# Patient Record
Sex: Female | Born: 1950 | Race: White | Hispanic: No | Smoking: Never smoker
Health system: Southern US, Community
[De-identification: ages and names within clinical notes are randomized; demographics above are authoritative.]

## PROBLEM LIST (undated history)

## (undated) DIAGNOSIS — K219 Gastro-esophageal reflux disease without esophagitis: Secondary | ICD-10-CM

## (undated) DIAGNOSIS — J302 Other seasonal allergic rhinitis: Secondary | ICD-10-CM

## (undated) DIAGNOSIS — M1990 Unspecified osteoarthritis, unspecified site: Secondary | ICD-10-CM

## (undated) DIAGNOSIS — H269 Unspecified cataract: Secondary | ICD-10-CM

## (undated) DIAGNOSIS — F32A Depression, unspecified: Secondary | ICD-10-CM

## (undated) DIAGNOSIS — N393 Stress incontinence (female) (male): Secondary | ICD-10-CM

## (undated) DIAGNOSIS — M8580 Other specified disorders of bone density and structure, unspecified site: Secondary | ICD-10-CM

## (undated) DIAGNOSIS — J45909 Unspecified asthma, uncomplicated: Secondary | ICD-10-CM

## (undated) DIAGNOSIS — G4730 Sleep apnea, unspecified: Secondary | ICD-10-CM

## (undated) DIAGNOSIS — J189 Pneumonia, unspecified organism: Secondary | ICD-10-CM

## (undated) DIAGNOSIS — A483 Toxic shock syndrome: Secondary | ICD-10-CM

## (undated) DIAGNOSIS — F329 Major depressive disorder, single episode, unspecified: Secondary | ICD-10-CM

## (undated) DIAGNOSIS — F419 Anxiety disorder, unspecified: Secondary | ICD-10-CM

## (undated) DIAGNOSIS — E785 Hyperlipidemia, unspecified: Secondary | ICD-10-CM

## (undated) DIAGNOSIS — K589 Irritable bowel syndrome without diarrhea: Secondary | ICD-10-CM

## (undated) DIAGNOSIS — Z9989 Dependence on other enabling machines and devices: Secondary | ICD-10-CM

## (undated) DIAGNOSIS — R011 Cardiac murmur, unspecified: Secondary | ICD-10-CM

## (undated) DIAGNOSIS — B379 Candidiasis, unspecified: Secondary | ICD-10-CM

## (undated) DIAGNOSIS — R319 Hematuria, unspecified: Secondary | ICD-10-CM

## (undated) DIAGNOSIS — S52502A Unspecified fracture of the lower end of left radius, initial encounter for closed fracture: Secondary | ICD-10-CM

## (undated) DIAGNOSIS — E039 Hypothyroidism, unspecified: Secondary | ICD-10-CM

## (undated) HISTORY — DX: Other specified disorders of bone density and structure, unspecified site: M85.80

## (undated) HISTORY — DX: Irritable bowel syndrome, unspecified: K58.9

## (undated) HISTORY — DX: Hematuria, unspecified: R31.9

## (undated) HISTORY — PX: TOTAL KNEE ARTHROPLASTY: SHX125

## (undated) HISTORY — DX: Dependence on other enabling machines and devices: Z99.89

## (undated) HISTORY — DX: Stress incontinence (female) (male): N39.3

## (undated) HISTORY — PX: CATARACT EXTRACTION: SUR2

## (undated) HISTORY — DX: Candidiasis, unspecified: B37.9

## (undated) HISTORY — PX: COLONOSCOPY: SHX174

## (undated) HISTORY — DX: Unspecified asthma, uncomplicated: J45.909

## (undated) HISTORY — DX: Hyperlipidemia, unspecified: E78.5

## (undated) HISTORY — PX: TONSILLECTOMY: SUR1361

## (undated) HISTORY — PX: JOINT REPLACEMENT: SHX530

---

## 1998-12-24 ENCOUNTER — Other Ambulatory Visit: Admission: RE | Admit: 1998-12-24 | Discharge: 1998-12-24 | Payer: Self-pay | Admitting: Obstetrics and Gynecology

## 1999-12-08 ENCOUNTER — Emergency Department (HOSPITAL_COMMUNITY): Admission: EM | Admit: 1999-12-08 | Discharge: 1999-12-08 | Payer: Self-pay | Admitting: Emergency Medicine

## 1999-12-18 ENCOUNTER — Encounter: Payer: Self-pay | Admitting: Emergency Medicine

## 1999-12-18 ENCOUNTER — Emergency Department (HOSPITAL_COMMUNITY): Admission: EM | Admit: 1999-12-18 | Discharge: 1999-12-18 | Payer: Self-pay | Admitting: Emergency Medicine

## 2001-05-01 ENCOUNTER — Other Ambulatory Visit: Admission: RE | Admit: 2001-05-01 | Discharge: 2001-05-01 | Payer: Self-pay | Admitting: Obstetrics and Gynecology

## 2004-06-23 ENCOUNTER — Ambulatory Visit (HOSPITAL_COMMUNITY): Admission: RE | Admit: 2004-06-23 | Discharge: 2004-06-23 | Payer: Self-pay | Admitting: Orthopedic Surgery

## 2004-08-08 ENCOUNTER — Encounter: Admission: RE | Admit: 2004-08-08 | Discharge: 2004-11-06 | Payer: Self-pay | Admitting: Orthopedic Surgery

## 2004-11-03 ENCOUNTER — Inpatient Hospital Stay (HOSPITAL_COMMUNITY): Admission: RE | Admit: 2004-11-03 | Discharge: 2004-11-06 | Payer: Self-pay | Admitting: Orthopedic Surgery

## 2004-11-30 ENCOUNTER — Encounter: Admission: RE | Admit: 2004-11-30 | Discharge: 2004-12-16 | Payer: Self-pay | Admitting: Orthopedic Surgery

## 2005-01-06 ENCOUNTER — Inpatient Hospital Stay (HOSPITAL_COMMUNITY): Admission: RE | Admit: 2005-01-06 | Discharge: 2005-01-09 | Payer: Self-pay | Admitting: Orthopedic Surgery

## 2005-01-31 ENCOUNTER — Encounter: Admission: RE | Admit: 2005-01-31 | Discharge: 2005-02-24 | Payer: Self-pay | Admitting: Orthopedic Surgery

## 2005-06-09 ENCOUNTER — Emergency Department (HOSPITAL_COMMUNITY): Admission: EM | Admit: 2005-06-09 | Discharge: 2005-06-10 | Payer: Self-pay | Admitting: Emergency Medicine

## 2006-12-23 ENCOUNTER — Emergency Department (HOSPITAL_COMMUNITY): Admission: EM | Admit: 2006-12-23 | Discharge: 2006-12-24 | Payer: Self-pay | Admitting: Emergency Medicine

## 2008-09-01 ENCOUNTER — Ambulatory Visit (HOSPITAL_COMMUNITY): Admission: RE | Admit: 2008-09-01 | Discharge: 2008-09-01 | Payer: Self-pay | Admitting: Gastroenterology

## 2009-08-03 ENCOUNTER — Emergency Department (HOSPITAL_COMMUNITY): Admission: EM | Admit: 2009-08-03 | Discharge: 2009-08-03 | Payer: Self-pay | Admitting: Family Medicine

## 2010-07-05 LAB — POCT URINALYSIS DIP (DEVICE)
Glucose, UA: NEGATIVE mg/dL
Nitrite: NEGATIVE
Protein, ur: 30 mg/dL — AB
Specific Gravity, Urine: >=1.03 (ref 1.005–1.030)
Urobilinogen, UA: 1 mg/dL (ref 0.0–1.0)
pH: 5.5 (ref 5.0–8.0)

## 2010-07-05 LAB — URINE CULTURE: Colony Count: 90000

## 2010-08-30 NOTE — Op Note (Signed)
NAMEGLENNIE, BOSE NO.:  [id number]   MEDICAL RECORD NO.:  [id number]          PATIENT TYPE:  AMB   LOCATION:  ENDO                         FACILITY:  [facility]   PHYSICIAN:  Bernette Redbird, M.D.   DATE OF BIRTH:  17-Dec-1950   DATE OF PROCEDURE:  09/01/2008  DATE OF DISCHARGE:                               OPERATIVE REPORT   PROCEDURE:  Colonoscopy.   INDICATION:  A 60 year old female with family history of colon polyps in  her father and colon cancer in her paternal grandmother, no worrisome  symptoms, for initial colon cancer-screening evaluation.   FINDINGS:  Pan colonic diverticulosis, otherwise normal exam.   PROCEDURE:  The patient came as an outpatient to the [facility]  endoscopy unit.  The risks of the procedure had been reviewed with the  patient who had provided written consent.  Sedation was fentanyl 100 mcg  and Versed 10 mg IV without clinical instability.   The Pentax adult video colonoscope was advanced around the colon to the  terminal ileum, using a moderate amount of external abdominal  compression to facilitate advancement.  Pullback was then performed.  The quality of prep was excellent; hence, felt that all areas were well  seen.   This was a normal examination except for pan colonic diverticulosis of  moderate severity.  No polyps, cancer, colitis or vascular ectasia were  observed.  In retroflexion, the rectum was unremarkable.  No biopsies  were obtained.  The patient tolerated the procedure well and there were  no apparent complications.   IMPRESSION:  Pan colonic diverticulosis; otherwise, normal screening  exam in a patient with some mild risk factors for colon cancer.   PLAN:  Repeat colonoscopy in 5 years with consideration to subsequent  exams being at longer intervals if the next exam is negative for polyps.           ______________________________  Bernette Redbird, M.D.     RB/MEDQ  D:  09/01/2008  T:  09/01/2008   Job:  [id number]   cc:   Marsa Aris, Dr.

## 2010-09-02 NOTE — Op Note (Signed)
NAMETIFANNY, Belinda [name] NO.:  [id number]   MEDICAL RECORD NO.:  [id number]          PATIENT TYPE:  INP   LOCATION:  1516                         FACILITY:  [facility]   PHYSICIAN:  Madlyn Frankel. Charlann Boxer, M.D.  DATE OF BIRTH:  [birth year]   DATE OF PROCEDURE:  11/03/2004  DATE OF DISCHARGE:                                 OPERATIVE REPORT   PREOPERATIVE DIAGNOSIS:  End-stage right knee osteoarthritis.   POSTOPERATIVE DIAGNOSIS:  End-stage right knee osteoarthritis.   PROCEDURE:  Right total knee replacement.   COMPONENTS USED:  DePuy rotating platform posterior stabilized system with a  size 3 femur, size 3 tibia and size 15 mm posterior stabilized rotating  platform insert.   SURGEON:  Madlyn Frankel. Charlann Boxer, M.D.   ASSISTANT:  Cherly Beach, P.A.   ANESTHESIA:  General.   ESTIMATED BLOOD LOSS:  1 liter.   Please note there is a second procedure that the patient had an extensive  synovectomy.   DRAINS:  Drains x1.   COMPLICATIONS:  None apparent.   INDICATIONS FOR PROCEDURE:  Ms. Vannostrand is a 60 year old female who I have  been following in the office for bilateral knee osteoarthritis. She is  status post left knee arthroscopy for mechanical symptoms. She has had  persistent and unrelenting discomfort despite conservative management of  this right knee.  She wished to proceed with surgery. Her main goal and  purpose is to restore her function so she can get back into the work force.  The risks and benefits were reviewed, consent obtained.   DESCRIPTION OF PROCEDURE:  The patient was brought to the operative theater.  Once adequate anesthesia and preoperative antibiotics, 1 gram of Ancef were  administered, the patient was positioned supine on the operating table with  the right leg in a proximal thigh tourniquet. The right lower extremity was  then prepped and draped in a sterile fashion. A midline incision was made  followed by a modified medial parapatellar  arthrotomy. The knee was exposed  without patella eversion. The patient was noted to have a very hyperemic and  hypertrophic synovium throughout the superior and proximal aspect of the  knee. Once the knee was exposed including meniscectomy, attention was  directed to the femur. A starting drill was used followed by intramedullary  device with 5 degrees of valgus, 10 mm of bone resected off the distal  femur. It was sized and determined that a size 3 femur would best fit, a  size 3 block was positioned. This was based off the external rotation of the  posterior condyles which relatively matched the perpendicular Whitesides  line. I was pleased with this. The cuts were made, anterior, posterior and  chamfer cuts. It was at this point that a partial synovectomy was carried  out.   Please note that an entire complete synovectomy was carried out once the  trial components were in position.   Once these cuts were made, attention was directed to the box. This was  centered off the lateral aspect of the medial femoral condyle. Following  this, attention was directed to the  tibia. An extramedullary tibial device  was used in line with the medial third of the tibial tubercle and parallel  to the tibial shaft by palpation. Initially I was going to plan on resecting  10 mm off the lateral side. The patient was noted to have an extensive  amount of wear to the posterior medial aspect of her tibia. Initially this  10 mm cut was made, however, this defect was still present. Despite  debriding the osteophytes on the medial proximal tibia, there was still a  defect. For this reason, I chose to resect 4 more millimeters of bone in  order to remove this defect. Note that the defect was not a contained  defect. I essentially removed this entire defect following removal of these  osteophytes. At this point, I sized this tibia to be a size 3. This matched  the femoral component. Trial reduction was carried  out initially with a 12.5  and then a 15 which I felt came out to full extension and I was less  concerned with hyperextension. The knee felt good in extension all the way  through flexion. At this point with the trial components in position, the  tibial rotation was marked using a fixed bearing component. The attention  was now directed to the complete synovectomy which was done with a bovie. I  then attended to the patella. Prepatellar measurement was 23.5 mm. I  resected down to 14 mm by hand. I trialed a 35 mm patella button. This  tracked excellently without application of thumb pressure. Following this,  trial components were removed. Final tibial preparation was carried out for  rotating platform system. This included preparing it as rotating the  position noted with the fixed bearing prosthesis. Once tibial preparation  was carried out, the final components were brought to the field including  the 15 mm polyethylene liner. The wound was then copiously irrigated with a  pulse lavage knee system. Cement was prepared. All three components were  cemented into position with excessive cement being removed. This was done  without complication. A 15 mm cruciate retaining polyethylene liner was  placed, the knee brought into extension to allow the cement to cure. Once  the cement had cured, excessive cement was removed.   The final polyethylene liner was placed and the tourniquet was let down  based on the synovectomy. At this point, the patient was noted to have  fairly significant oozing all throughout the proximal suprapatellar pouch  related to the synovectomy. It would be controlled with knee flexion,  however, to close the wound the knee was in extension. Note that the  extension mechanism was then reapproximated using a #1 PDS. About 3/4 of the  way through it, I realized that perhaps a lot of this was the venous tourniquet pressure and the tourniquet was removed. This helped to  control  the oozing.   The remainder of the wound was closed in layers with a running 4-0 Monocryl.  The knee was then dressed sterilely with Steri-Strips, dressing, sponge and  a bulky sterile dressing.   Despite this oozing, the patient remained completely stable throughout the  case. She had palpable pulses both in the posterior tib and dorsalis pedis  posteriorly.   She was transferred extubated to the recovery room in stable condition where  her hematocrit and hemoglobin will be checked.   Will plan on her having a normal routine postop course.     MDO/MEDQ  D:  11/03/2004  T:  11/04/2004  Job:  [id number]

## 2010-09-02 NOTE — Discharge Summary (Signed)
Belinda [name], HARLIN NO.:  [id number]   MEDICAL RECORD NO.:  [id number]          PATIENT TYPE:  INP   LOCATION:  1516                         FACILITY:  [facility]   PHYSICIAN:  Madlyn Frankel. Charlann Boxer, M.D.  DATE OF BIRTH:  21-Mar-1951   DATE OF ADMISSION:  11/03/2004  DATE OF DISCHARGE:  11/06/2004                                 DISCHARGE SUMMARY   ADMITTING DIAGNOSES:  1.  Bilateral knee osteoarthritis, worse on the right than the left.  2.  Hypothyroidism.   DISCHARGE DIAGNOSIS:  1.  Bilateral knee osteoarthritis, worse on the right than the left.  2.  Hypothyroidism.  3.  Postoperative anemia treated with transfusion.   OPERATION:  On November 03, 2004, the patient underwent right total knee  replacement arthroplasty, all components cemented. Jenne Campus, P.A.-C.,  assisted.   BRIEF HISTORY:  This 60 year old lady had been seen by Dr. Charlann Boxer for  continuing progressive problems concerning pain in both her knees. She  underwent the left knee arthroscopy but has continued with progressive pain  into the right knee to the point where she is quite miserable. After risks  and benefits of surgery were described to the patient, she was admitted for  the above procedure.   COURSE IN THE HOSPITAL:  The patient tolerated the surgical procedure quite  well. She was placed on Coumadin protocol postoperatively for prevention of  DVT. It was noted that she did have a considerable blood loss due to the  synovitis that she had in the synovium that was resected intraoperatively.  She was transfused and brought her hemoglobin up to 10.7. She maintained a  good hemoglobin throughout her hospitalization, and final hemoglobin was  9.2, hematocrit was 26.8. She was asymptomatic. She was noted to have a  small rise in temperature of 102.2. Bedside incentive spirometer was  encouraged, and this came down. Neurovascular remained intact in the  operative extremity. She worked with physical  therapy for total knee  protocol and did quite well with that. Day of discharge, she had scant  bloody drainage superficially to the wound, but otherwise, she was  completely stable orthopedically. Neurovascular was intact. She was awake  and alert. Home arrangements for home health was arranged with Genevieve Norlander and  home equipment was given. Laboratory values in the hospital hematologically  showed a preoperative CBC completely within normal limits. Hemoglobin was  12.9, hematocrit 37.5; final hemoglobin 9.2, hematocrit 26.8. Blood  chemistries were normal. Urinalysis negative for urinary tract infection.  Chest x-ray shows septations or blebs in right upper lobe; no active  disease. No electrocardiogram seen on this chart.   CONDITION ON DISCHARGE:  Improved, stable.   PLAN:  The patient discharged to home to continue with home medications and  diet. Continue weightbearing as tolerated on the operative extremity. She is  to be on Coumadin protocol for four weeks after date of surgery, and a  Coumadin prescription is given. She will use Vicodin for discomfort and  Robaxin as a muscle relaxant. Return to see Dr. Charlann Boxer in about two weeks  after date of surgery.  Call if any problems.       DLU/MEDQ  D:  11/17/2004  T:  11/17/2004  Job:  [id number]   cc:   Loraine Leriche A. Waynard Edwards, M.D.  [address]  [place]  [place] [address]  Fax: [phone]

## 2010-09-02 NOTE — Op Note (Signed)
NAMEHAYVEN, Belinda [name] NO.:  [id number]   MEDICAL RECORD NO.:  [id number]          PATIENT TYPE:  INP   LOCATION:  1615                         FACILITY:  [facility]   PHYSICIAN:  Madlyn Frankel. Charlann Boxer, M.D.  DATE OF BIRTH:  [birth year]   DATE OF PROCEDURE:  01/06/2005  DATE OF DISCHARGE:                                 OPERATIVE REPORT   PREOPERATIVE DIAGNOSES:  End-stage left knee osteoarthritis.   POSTOPERATIVE DIAGNOSES:  End-stage left knee osteoarthritis.   PROCEDURE:  Left total knee replacement.   COMPONENTS USED:  DePuy rotating platform posterior stabilized system with a  size 3 femur, size 3 tibia, 17.5 mm posterior stabilized rotating platform  poly and a size 41 patella button.   SURGEON:  Madlyn Frankel. Charlann Boxer, M.D.   ASSISTANT:  Cherly Beach, P.A.-C.   ANESTHESIA:  General.   ESTIMATED BLOOD LOSS:  Minimal.   TOURNIQUET TIME:  80 minutes at 300 mmHg.   COMPLICATIONS:  None.   DRAINS:  Drains x1.   INDICATIONS FOR PROCEDURE:  Ms. Larsen is a very pleasant 60 year old  female who I have been following for bilateral knee osteoarthritis. She is  status post a right total knee replacement in July which has done well. She  is now ready to have her left knee done. This knee has a significant amount  of varus deformity through the proximal tibia due to her degenerative  change. She has failed all conservative measures and wishes to proceed with  total knee replacement based on her success and recovery from the right  knee. The risks and benefits were reviewed with the patient prior to consent  obtainment.   DESCRIPTION OF PROCEDURE:  The patient was brought to the operative theater.  Once adequate anesthesia and preoperative antibiotics, 1 gram of Ancef, were  administered, the patient was positioned supine on the operative table. A  proximal thigh tourniquet was placed on the left lower extremity, left lower  extremity was then prepped and draped in a  sterile fashion. A midline  incision was made followed by a modified medial parapatellar arthrotomy. The  patella was not everted at first but just subluxated. Knee exposure was  obtained. Removal of osteophytes medial and lateral within the trochlea was  carried out. An intramedullary rod was then passed in the femur and 10 mm of  bone was resected off the distal femur at 5 degrees valgus. At this point,  the femur was sized and this matched her contralateral lower extremity which  was a size 3 as well.   Please note that I did make an initial resection off the tibia measuring  about 10 mm off the lateral side with the extramedullary device. This helped  decompress the joint and allowed for me to use the measurement device more  accurately. Based on the [facility]' line and the epicondylar axis, the  posterior condylar axis seemed to be slightly internally rotated, so using a  navicular gouge I externally rotated the size 3 cutting block. Anterior,  posterior and chamfer cuts were then made. The anterior femur actually still  had an  appearance of a slight internal rotation though it matched the  epicondyle axis.   Please note too that at trial reduction component placement, there was no  problems with patella tracking.   Following preparation of the femur, attention was redirected back to the  tibia. The patient was noted to have a pretty significant tibial deformity.  I chose to resect 2 more millimeters of bone off the distal tibia to remove  this deformity and allow for fresh cut surface of bone. Following this, I  prepared the femoral box based off the lateral aspect of the medial femoral  condyle. The trial reduction was then carried out with a 3 femur, a size 3  tibia initially with a 12.5 poly. I did then increase the polyethylene trial  to 15. There was a slight bit of play but not significant with a 15. My  final options with poly's were #15 and 17.5. At this point, the  trial  components was in place and tibial rotation marked and attention was  directed to the patella. The prepatellar cut was about 24 mm, I resected  down to 14 mm and thus placed a 41 patella button. Lug holes were drilled,  the trial was placed and the patella tracked without application of any  pressure. At this point, the trial components were removed. Final  preparation of the tibia was carried out with keel punch and drilling for  the rotating platform system based off rotation mark. Note though, however,  that the proximal tibia did not allow for that much extra rotation and the  best way to get full fitted tibial component was a slight bit of internal  rotation.   At this point, the wound was copiously irrigated with normal saline solution  with the pulse lavage. Final components were brought onto the field. The  knee was then injected with a combination of 0.5% Marcaine  60 mL, 0.5 mL of  1:100,000 epinephrine and 1 mL of Toradol 30 mg. Following this, the  components were cemented into position with the tibia, femur and then  patella. I placed a 15 spacer in first to allow the cement to cure. Once it  had dried and excess cement removed, I trialed the 17 poly and I felt that  this provided a little bit more stability and extension. She had a little  bit of laxity to begin with. Despite her significant varus deformity, she  had no flexion or traction but she had hyperextension so I felt the 17.5 to  helped to tighten her up a little bit more. She was stable throughout the  range of motion and the patella tracked perfectly. With this, the final 17.5  poly was placed after removing excessive cement. The tourniquet was let down  at closure.   The knee was irrigated with pulse lavage once more at this point. The knee  was then closed in flexion with #1 PDS. The remainder of the wound was closed in layers with a running 4-0 Monocryl as the other side was. The knee  was then  cleaned, dried and dressed sterilely with Steri-Strips, dressings,  sponges and a sterile bulky Jones dressing. She was transferred to the  recovery room in stable condition.      Madlyn Frankel Charlann Boxer, M.D.  Electronically Signed     MDO/MEDQ  D:  01/06/2005  T:  01/07/2005  Job:  [id number]

## 2010-09-02 NOTE — Discharge Summary (Signed)
Belinda [name], THORNELL NO.:  [id number]   MEDICAL RECORD NO.:  [id number]          PATIENT TYPE:  INP   LOCATION:  1615                         FACILITY:  [facility]   PHYSICIAN:  Madlyn Frankel. Charlann Boxer, M.D.  DATE OF BIRTH:  [birth year]   DATE OF ADMISSION:  01/06/2005  DATE OF DISCHARGE:  01/09/2005                                 DISCHARGE SUMMARY   ADMISSION DIAGNOSES:  1.  Severe left knee osteoarthritis.  2.  Hyperthyroidism.  3.  Osteoarthritis.   DISCHARGE DIAGNOSES:  1.  Severe left knee osteoarthritis.  2.  Hyperthyroidism.  3.  Osteoarthritis.  4.  Mild postoperative anemia.   OPERATION:  On January 06, 2005 the patient underwent left total knee  replacement arthroplasty with DePuy rotating platform posterior stabilized  system with all 3 components cemented. Jenne Campus, PA-C assisted.   BRIEF HISTORY:  This 60 year old lady with bilateral osteoarthritis of her  knees underwent a right total knee replacement arthroplasty in July, 2006  and did really quite well with that. She has found that her left knee is  markedly interfering with her day-to-day activities and is highly desirous  to undergo total knee replacement to the left knee. After the risks and  benefits of surgery have been explained to the patient and reviewing the  fact that conservative measures had failed it was decided to go ahead with  the above procedure.   HOSPITAL COURSE:  The patient tolerated surgical procedure quite well. She  was very well aware of the postoperative protocol for total knee  replacement. She was placed on Coumadin protocol postoperative for  prevention of DVT. She had a mild temp postoperatively. Incentive spirometer  was used which brought down her temperature.   Her hemoglobin stabilized at around 9.0. Knee wound was dry without evidence  of infection. Neurovascularly was intact in the operative extremity. It was  felt she could be maintained in her home  environment with home health. She  was discharged home in improved condition. Laboratory values hematologically  showed a CBC preoperatively essentially within normal limits. Hemoglobin was  12.2 with hematocrit 35.7. Final hemoglobin 9.0 and hematocrit 25.1. Blood  chemistries remained normal x2 throughout her hospitalization. Urinalysis  showed some calcium oxalate crystals, esterase was seen in trace. This was  preoperatively. No electrocardiogram or EKG seen on this chart.   CONDITION ON DISCHARGE:  Improved, stable.   PLAN:  The patient discharged to her home in the care of her family with  home health. She is given prescriptions for Vicodin for discomfort, Robaxin  500 mg for muscle spasm and Coumadin protocol for 4 weeks after date of  surgery. Should she have any medical problems I have certainly encouraged  her to follow with Dr. Waynard Edwards her medical physician.      Dooley L. Cherlynn June.      Madlyn Frankel Charlann Boxer, M.D.  Electronically Signed    DLU/MEDQ  D:  01/26/2005  T:  01/26/2005  Job:  [id number]   cc:   Loraine Leriche A. Perini, M.D.  Fax: [phone]

## 2010-09-02 NOTE — Op Note (Signed)
[name], [name] NO.:  [id number]   MEDICAL RECORD NO.:  [id number]          PATIENT TYPE:  AMB   LOCATION:  DAY                          FACILITY:  [facility]   PHYSICIAN:  Madlyn Frankel. Charlann Boxer, M.D.  DATE OF BIRTH:  June 19, 1950   DATE OF PROCEDURE:  06/23/2004  DATE OF DISCHARGE:                                 OPERATIVE REPORT   PREOPERATIVE DIAGNOSES:  Left medial and lateral meniscal tears and  chondromalacia.   POSTOPERATIVE DIAGNOSES/FINDINGS:  1.  Hypertrophic/hyperemic synovium throughout the entire aspect of the      knee.  2.  Degenerative complex tearing to both the medial and lateral meniscus      primarily involving the posterior horn in the mid bodies.  3.  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   PROCEDURE:  Left knee diagnostic arthroscopy with synovectomy, medial and  lateral partial meniscectomies and tricompartmental chondroplasty.   SURGEON:  Madlyn Frankel. Charlann Boxer, M.D.   ASSISTANT:  None.   ANESTHESIA:  Local plus MAC.   COMPLICATIONS:  None.   DISPOSITION:  Stable to the recovery room.   INDICATIONS FOR PROCEDURE:  [name] is a 60 year old female who I had  seen in the last year.  She was noted at that time to have advanced  degenerative changes to her right knee; however, presented with mechanical  symptoms to her left knee. She had failed conservative measures with  Cortizone injections but based on financial standpoint was unable to afford  having an MRI done. For that reason, we had discussed at the time, operative  intervention as a diagnostic tool as well as treatment standpoint.  It was  not until recently until she was able to discuss this issue with vocational  rehab in an effort to try to get her back and of course they have agreed to  pay for her care. To begin with this left knee was most significantly  involved for her. After reviewing the risks and benefits with her again as  we had done in the past including the fact that she may have persistent  symptoms, she consents for the procedure.   DESCRIPTION OF PROCEDURE:  The patient was brought to the operative theater.  Once adequate anesthesia and preoperative antibiotics, 1 g of  Ancef, were  administered the patient was positioned supine with her left leg in a leg  holder. The left lower extremity was then prepped and draped in a sterile  fashion. Inferolateral and superolateral, the inferomedial portals were  utilized, diagnostic evaluation of the knee revealed the above noted  findings. There was a very large effusion initially. Visualization of the  anterior compartment of the knee was obscured secondary to a large amount of  synovium present. Synovectomy was carried out as needed through the anterior  medial aspect of the knee to allow for exposure. Inferomedial portal was  used primarily as the working portal with biting baskets and the 3.52mm  shaver utilized to debride the posterior mid body of the medial meniscus as  well as to perform chondroplasty. The lateral compartment was entered as  well and posterior horn debridement carried out.  Following this, attention  was directed to the patellofemoral compartment where debridement was carried  out as necessary. Following this, the knee was reexamined for any loose  fragments of cartilage.  Once the knee was suction drained, portals  reapproximated using 3-0 nylon. The knee was then wrapped in a sterile bulky  Jones dressing. The patient was transferred to the recovery room in stable  condition. The patient tolerated the procedure well without apparent  complications.   The patient will return to see [place] in 10 days for suture removal. She will be  weightbearing as tolerated.  Will begin her rehab and see how things go  along with regards to the right knee.      MDO/MEDQ  D:   06/23/2004  T:  06/23/2004  Job:  [id number]

## 2010-09-02 NOTE — H&P (Signed)
[name], [name] NO.:  [id number]   MEDICAL RECORD NO.:  [id number]           PATIENT TYPE:   LOCATION:                                 FACILITY:   PHYSICIAN:  Madlyn Frankel. Charlann Boxer, M.D.       DATE OF BIRTH:   DATE OF ADMISSION:  11/03/2004  DATE OF DISCHARGE:                                HISTORY & PHYSICAL   CHIEF COMPLAINT:  Right knee pain.   HISTORY OF PRESENT ILLNESS:  The patient has had right knee pain that has  been off and on for the past 3 years and has been worsening. She has been  diagnosed with severe osteoarthritis and is scheduled for a right total knee  arthroplasty by Dr. Durene Romans on November 03, 2004.   The patient has an allergy to PENICILLIN.   CURRENT MEDICATIONS:  1.  Synthroid 200 mcg daily.  2.  Zoloft 50 mg b.i.d. to t.i.d.  3.  Glucosamine.  4.  Celebrex 200 mg daily.   PAST MEDICAL HISTORY:  1.  Hypothyroidism.  2.  Osteoarthritis.   FAMILY MEDICAL HISTORY:  Hyperlipidemia and positive for cancer.   REVIEW OF SYSTEMS:  Negative except for pain with ambulation.   PHYSICAL EXAMINATION:  VITAL SIGNS:  Pulse 90, respirations 18, blood  pressure 108/82.  GENERAL:  The patient is a healthy-appearing 60 year old female in no acute  distress, pleasant in mood and affect, alert and oriented x3.  HEENT:  Shows cranial nerves II-XII are grossly intact.  NECK:  Shows full range of motion without any tenderness.  CHEST:  Active breath sounds bilaterally with no wheezes, rhonchi, or rales.  HEART:  Shows a 2/6 early systolic murmur but it has a regular rate.  ABDOMEN:  Nontender and nondistended with active bowel sounds. No pulsatile  masses felt.  EXTREMITIES:  Shows moderate crepitus with bilateral knees with range of  motion. Mild medial and lateral joint line tenderness to the right. Mild  medial joint line tenderness to the left. Neurovascularly she is intact,  skin intact. She does have some mild pedal edema bilaterally but no  pitting  and no rashes were noted.   X-ray shows bilateral knee osteoarthritis which is severe.   IMPRESSION:  Bilateral knee osteoarthritis, severe, right greater than the  left.   PLAN:  To have right total knee arthroplasty by Dr. Durene Romans on November 03, 2004.       TBD/MEDQ  D:  10/26/2004  T:  10/26/2004  Job:  [id number]

## 2010-09-02 NOTE — H&P (Signed)
Belinda [name], Belinda [name]   MEDICAL RECORD NO.:  [name]          PATIENT TYPE:  INP   LOCATION:  NA                           FACILITY:  [facility]   PHYSICIAN:  Madlyn Frankel. Charlann Boxer, M.D.  DATE OF BIRTH:  [birth year]   DATE OF ADMISSION:  01/06/2005  DATE OF DISCHARGE:                                HISTORY & PHYSICAL   CHIEF COMPLAINT:  Left knee pain.   HISTORY OF PRESENT ILLNESS:  Patient has had left knee pain for many years  and pain with ambulation.  She has elected to have a left total knee  arthroplasty by Dr. Durene Romans on January 06, 2005.   The patient has an allergy to NAFCILLIN.   MEDICATIONS:  1.  Synthroid 200 mcg daily.  2.  Celebrex 200 mg daily.  3.  Vicodin 5/500 1 tab q.4-6h. p.r.n. pain.  4.  GenTeal eye drops 2-4 drops bilateral eyes each day.  5.  Zoloft 100 mg daily.   PAST MEDICAL HISTORY:  1.  Hypothyroidism.  2.  Osteoarthritis.   SOCIAL HISTORY:  Patient is divorced.  Does not smoke.  Does not use  alcohol.  She has a one floor house.  Her primary care physician is Dr. Rodrigo Ran.   FAMILY HISTORY:  Negative.   REVIEW OF SYSTEMS:  Negative except for pain on ambulation and history of a  heart murmur.   PHYSICAL EXAMINATION:  VITAL SIGNS:  Pulse 84, respirations 18, blood  pressure 118/62.  GENERAL:  Patient is a healthy-appearing 60 year old female in no acute  distress.  Pleasant mood and affect.  Alert and oriented x3 here in the  office today.  HEAD/NECK:  Cranial nerves II-XII are grossly intact.  She has full range of  motion of her cervical spine with no tenderness.  No active muscle spasms.  ? test is negative x2.  CHEST:  Active breath sounds bilaterally with no wheezes, rhonchi, or rales.  HEART:  A 3/6 murmur that is systolic but is regular in rate.  ABDOMEN:  Nontender, nondistended with active bowel sounds.  No pulsatile  masses felt.  EXTREMITIES:  Moderate crepitus of the left knee with some  mild tenderness  of the left knee with range of motion and joint line tenderness, especially  over the medial side.  SKIN:  No rashes.  No pedal edema.   X-rays show left knee osteoarthritis, which is severe.   IMPRESSION:  Severe left knee osteoarthritis, severe.   PLAN:  Left total knee arthroplasty by Dr. Durene Romans on January 06, 2005.      Thomas B. Durwin Nora, P.A.      Madlyn Frankel Charlann Boxer, M.D.  Electronically Signed    TBD/MEDQ  D:  12/28/2004  T:  12/28/2004  Job:  [id number]

## 2013-09-22 ENCOUNTER — Encounter (HOSPITAL_COMMUNITY): Payer: Self-pay | Admitting: Emergency Medicine

## 2013-09-22 ENCOUNTER — Emergency Department (HOSPITAL_COMMUNITY): Payer: Worker's Compensation

## 2013-09-22 ENCOUNTER — Emergency Department (HOSPITAL_COMMUNITY)
Admission: EM | Admit: 2013-09-22 | Discharge: 2013-09-22 | Disposition: A | Discharge status: Home / Self Care | Payer: Worker's Compensation | Attending: Emergency Medicine | Admitting: Emergency Medicine

## 2013-09-22 DIAGNOSIS — Z880 Allergy status to penicillin: Secondary | ICD-10-CM | POA: Insufficient documentation

## 2013-09-22 DIAGNOSIS — R110 Nausea: Secondary | ICD-10-CM | POA: Insufficient documentation

## 2013-09-22 DIAGNOSIS — W100XXA Fall (on)(from) escalator, initial encounter: Secondary | ICD-10-CM

## 2013-09-22 DIAGNOSIS — Z8639 Personal history of other endocrine, nutritional and metabolic disease: Secondary | ICD-10-CM | POA: Insufficient documentation

## 2013-09-22 DIAGNOSIS — S91109A Unspecified open wound of unspecified toe(s) without damage to nail, initial encounter: Secondary | ICD-10-CM | POA: Insufficient documentation

## 2013-09-22 DIAGNOSIS — Y9289 Other specified places as the place of occurrence of the external cause: Secondary | ICD-10-CM | POA: Insufficient documentation

## 2013-09-22 DIAGNOSIS — S91312A Laceration without foreign body, left foot, initial encounter: Secondary | ICD-10-CM

## 2013-09-22 DIAGNOSIS — S91309A Unspecified open wound, unspecified foot, initial encounter: Secondary | ICD-10-CM | POA: Insufficient documentation

## 2013-09-22 DIAGNOSIS — W230XXA Caught, crushed, jammed, or pinched between moving objects, initial encounter: Secondary | ICD-10-CM | POA: Insufficient documentation

## 2013-09-22 DIAGNOSIS — Z862 Personal history of diseases of the blood and blood-forming organs and certain disorders involving the immune mechanism: Secondary | ICD-10-CM | POA: Insufficient documentation

## 2013-09-22 DIAGNOSIS — Y990 Civilian activity done for income or pay: Secondary | ICD-10-CM | POA: Insufficient documentation

## 2013-09-22 DIAGNOSIS — Y9389 Activity, other specified: Secondary | ICD-10-CM | POA: Insufficient documentation

## 2013-09-22 HISTORY — DX: Hypothyroidism, unspecified: E03.9

## 2013-09-22 NOTE — ED Notes (Signed)
Pt presents with c/o left foot laceration. Per EMS pt got her foot caught on an escalator, bleeding controlled. Lac is approx 4 inches per EMS.

## 2013-09-22 NOTE — ED Provider Notes (Signed)
CSN: [id number]     Arrival date & time 09/22/13  1853 History   None    This chart was scribed for non-physician practitioner working with Ephraim Hamburger, MD by Forrestine Him, ED Scribe. This patient was seen in room [place] and the patient's care was started at 9:35 PM.   Chief Complaint  Patient presents with  . Extremity Laceration   The history is provided by the patient. No language interpreter was used.    HPI Comments: Belinda [name] is a 63 y.o. Female with a PMHx of Hypothroidism who presents to the Emergency Department complaining of a laceration to the L foot sustained around 6 PM this evening. Pt states she was going up an escalator while at work and cut her foot after getting her foot caught. She also reports mild intermittent paresthesia to the L foot and nausea. She has not tried anything OTC or any home remedies for discomfort. At this time she denies any fever, chills, numbness, or loss of sensation. She denies currently being on any blood thinner. No history of diabetes. She has no other pertinent past medical history. No other concerns this visit.  Past Medical History  Diagnosis Date  . Hypothyroidism    Past Surgical History  Procedure Laterality Date  . Total knee arthroplasty      x 2  . Tonsillectomy     No family history on file. History  Substance Use Topics  . Smoking status: Never Smoker   . Smokeless tobacco: Not on file  . Alcohol Use: Yes     Comment: rarely    OB History   Grav Para Term Preterm Abortions TAB SAB Ect Mult Living                 Review of Systems  Constitutional: Negative for fever and chills.  HENT: Negative for congestion.   Eyes: Negative for redness.  Respiratory: Negative for cough.   Gastrointestinal: Positive for nausea.  Skin: Positive for wound (Laceration to L foot). Negative for rash.  Psychiatric/Behavioral: Negative for confusion.      Allergies  Penicillins  Home Medications   Prior to Admission  medications   Not on File   Triage Vitals: BP 111/72  Pulse 96  Temp(Src) 99.1 F (37.3 C) (Oral)  Resp 18  SpO2 93%   Physical Exam  Nursing note and vitals reviewed. Constitutional: She is oriented to person, place, and time. She appears well-developed and well-nourished.  HENT:  Head: Normocephalic and atraumatic.  Eyes: EOM are normal.  Neck: Normal range of motion.  Cardiovascular: Normal rate.   Pulses:      Dorsalis pedis pulses are 2+ on the left side.       Posterior tibial pulses are 2+ on the left side.  Cap refill <3 seconds all digits left foot.  Pulmonary/Chest: Effort normal.  Musculoskeletal: Normal range of motion. She exhibits tenderness.  Neurological: She is alert and oriented to person, place, and time.  Skin: Skin is warm and dry.  L FOOT: 0.5 laceration to the plantar aspect of L great toe 2 cm laceration with jagged edges on plantar aspect of 1st metatarsal 3 cm laceration with straight edges on planter aspect of first metatarsal  No active bleeding No foreign bodies Mild tenderness  Psychiatric: She has a normal mood and affect. Her behavior is normal.    ED Course  Procedures (including critical care time)  DIAGNOSTIC STUDIES: Oxygen Saturation is 93% on RA, adequate  by my interpretation.    COORDINATION OF CARE: 9:39 PM- Will clean area and apply steri-strips. Will order X-Ray. Discussed treatment plan with pt at bedside and pt agreed to plan.     LACERATION REPAIR Performed by: Noland Fordyce PA-C,  Consent: Verbal consent obtained. Risks and benefits: risks, benefits and alternatives were discussed Patient identity confirmed: provided demographic data Time out performed prior to procedure Prepped and Draped in normal sterile fashion Wound explored Laceration Location and Length: 0.5 laceration to the plantar aspect of L great toe 2 cm laceration with jagged edges on plantar aspect of 1st metatarsal 3 cm laceration with straight edges  on planter aspect of first metatarsal No Foreign Bodies seen or palpated Anesthesia: local infiltration Irrigation method: syringe Amount of cleaning: standard Skin closure: Steri-Strips Number of sutures or staples: 3 Patient tolerance: Patient tolerated the procedure well with no immediate complications.   Labs Review Labs Reviewed - No data to display  Imaging Review Dg Foot Complete Left  09/22/2013   CLINICAL DATA:  Left foot pain and lacerations after a fall.  EXAM: LEFT FOOT - COMPLETE 3+ VIEW  COMPARISON:  None.  FINDINGS: Degenerative changes in the first metatarsal phalangeal and interphalangeal joints. Mild diffuse bone demineralization. No evidence of acute fracture or dislocation. No focal bone lesion or bone destruction. Plantar calcaneal spur.  IMPRESSION: Degenerative changes.  No acute bony abnormalities.   Electronically Signed   By: Lucienne Capers M.D.   On: 09/22/2013 22:05     EKG Interpretation None      MDM   Final diagnoses:  Laceration of left foot  Fall, in, on, escalator    Pt presenting with 3 small lacerations to plantar aspect left foot. Laceration are not deep enough to require sutured wound care. Steri-strips placed then covered with pressure bandage. Pt states she has crutches at home so she can stay off left foot for 2 days to allow initial skin healing. Home care instructions provided. Return precautions provided. Pt verbalized understanding and agreement with tx plan.   I personally performed the services described in this documentation, which was scribed in my presence. The recorded information has been reviewed and is accurate.    Noland Fordyce, PA-C 09/22/13 2303

## 2013-09-22 NOTE — ED Notes (Signed)
MD at bedside. 

## 2013-09-23 NOTE — ED Provider Notes (Signed)
Medical screening examination/treatment/procedure(s) were performed by non-physician practitioner and as supervising physician I was immediately available for consultation/collaboration.   EKG Interpretation None        Ephraim Hamburger, MD 09/23/13 [phone]

## 2015-04-18 HISTORY — PX: CATARACT EXTRACTION: SUR2

## 2015-04-28 MED FILL — SERTRALINE HCL 100 MG TAB: 100 | 90 days supply | Qty: 90 | Fill #2

## 2015-04-28 MED FILL — LEVOTHYROXINE 200 MCG TAB: 200 | 90 days supply | Qty: 90 | Fill #1

## 2015-06-16 DIAGNOSIS — H524 Presbyopia: Secondary | ICD-10-CM | POA: Diagnosis not present

## 2015-06-16 DIAGNOSIS — H2513 Age-related nuclear cataract, bilateral: Secondary | ICD-10-CM | POA: Diagnosis not present

## 2015-08-09 MED FILL — SERTRALINE HCL 100 MG TAB: 100 | 90 days supply | Qty: 90 | Fill #3

## 2015-08-09 MED FILL — LEVOTHYROXINE 200 MCG TAB: 200 | 90 days supply | Qty: 90 | Fill #0

## 2015-08-27 DIAGNOSIS — H2511 Age-related nuclear cataract, right eye: Secondary | ICD-10-CM | POA: Diagnosis not present

## 2015-08-27 DIAGNOSIS — H18411 Arcus senilis, right eye: Secondary | ICD-10-CM | POA: Diagnosis not present

## 2015-08-27 DIAGNOSIS — H02839 Dermatochalasis of unspecified eye, unspecified eyelid: Secondary | ICD-10-CM | POA: Diagnosis not present

## 2015-08-27 DIAGNOSIS — H2512 Age-related nuclear cataract, left eye: Secondary | ICD-10-CM | POA: Diagnosis not present

## 2015-09-09 MED FILL — NYSTATIN 100,000 UNIT/GM PO: 100000 | 30 days supply | Qty: 60 | Fill #0

## 2015-09-29 MED FILL — DUREZOL 0.05% EYE DROPS: 0.05 | 30 days supply | Qty: 5 | Fill #0

## 2015-09-29 MED FILL — BESIVANCE 0.6% SUSP: 0.6 | 30 days supply | Qty: 5 | Fill #0

## 2015-09-29 MED FILL — PROLENSA 0.07% EYE DROPS: 0.07 | 30 days supply | Qty: 3 | Fill #0

## 2015-10-04 DIAGNOSIS — H2512 Age-related nuclear cataract, left eye: Secondary | ICD-10-CM | POA: Diagnosis not present

## 2015-10-04 MED FILL — VIT D2 1.25 MG (50,000 UNIT: 1.25 MG | 84 days supply | Qty: 12 | Fill #1

## 2015-10-05 DIAGNOSIS — H2511 Age-related nuclear cataract, right eye: Secondary | ICD-10-CM | POA: Diagnosis not present

## 2015-10-15 MED FILL — PROLENSA 0.07% EYE DROPS: 0.07 | 20 days supply | Qty: 3 | Fill #0

## 2015-10-19 MED FILL — BESIVANCE 0.6% SUSP: 0.6 | 30 days supply | Qty: 5 | Fill #0

## 2015-10-19 MED FILL — DUREZOL 0.05% EYE DROPS: 0.05 | 30 days supply | Qty: 5 | Fill #0

## 2015-10-24 ENCOUNTER — Emergency Department (HOSPITAL_COMMUNITY): Payer: 59

## 2015-10-24 ENCOUNTER — Encounter (HOSPITAL_COMMUNITY): Payer: Self-pay | Admitting: *Deleted

## 2015-10-24 ENCOUNTER — Emergency Department (HOSPITAL_COMMUNITY)
Admission: EM | Admit: 2015-10-24 | Discharge: 2015-10-24 | Disposition: A | Discharge status: Home / Self Care | Payer: 59 | Attending: Emergency Medicine | Admitting: Emergency Medicine

## 2015-10-24 DIAGNOSIS — F329 Major depressive disorder, single episode, unspecified: Secondary | ICD-10-CM | POA: Diagnosis not present

## 2015-10-24 DIAGNOSIS — Y939 Activity, unspecified: Secondary | ICD-10-CM | POA: Insufficient documentation

## 2015-10-24 DIAGNOSIS — W1839XA Other fall on same level, initial encounter: Secondary | ICD-10-CM | POA: Insufficient documentation

## 2015-10-24 DIAGNOSIS — S52502A Unspecified fracture of the lower end of left radius, initial encounter for closed fracture: Secondary | ICD-10-CM

## 2015-10-24 DIAGNOSIS — E039 Hypothyroidism, unspecified: Secondary | ICD-10-CM | POA: Diagnosis not present

## 2015-10-24 DIAGNOSIS — S59202A Unspecified physeal fracture of lower end of radius, left arm, initial encounter for closed fracture: Secondary | ICD-10-CM | POA: Insufficient documentation

## 2015-10-24 DIAGNOSIS — Y929 Unspecified place or not applicable: Secondary | ICD-10-CM | POA: Insufficient documentation

## 2015-10-24 DIAGNOSIS — S52592A Other fractures of lower end of left radius, initial encounter for closed fracture: Secondary | ICD-10-CM | POA: Diagnosis not present

## 2015-10-24 DIAGNOSIS — Y999 Unspecified external cause status: Secondary | ICD-10-CM | POA: Insufficient documentation

## 2015-10-24 DIAGNOSIS — S6992XA Unspecified injury of left wrist, hand and finger(s), initial encounter: Secondary | ICD-10-CM | POA: Diagnosis present

## 2015-10-24 HISTORY — DX: Unspecified cataract: H26.9

## 2015-10-24 HISTORY — DX: Cardiac murmur, unspecified: R01.1

## 2015-10-24 MED ORDER — BUPIVACAINE HCL 0.5 % IJ SOLN
50.0000 mL | Freq: Once | INTRAMUSCULAR | Status: AC
  Administered 2015-10-24: 50 mL
  Filled 2015-10-24: qty 50

## 2015-10-24 MED ORDER — HYDROCODONE-ACETAMINOPHEN 5-325 MG PO TABS: 1.0000 | ORAL_TABLET | Freq: Four times a day (QID) | ORAL | Status: DC | PRN

## 2015-10-24 MED ORDER — FENTANYL CITRATE (PF) 100 MCG/2ML IJ SOLN
50.0000 ug | Freq: Once | INTRAMUSCULAR | Status: DC
  Filled 2015-10-24: qty 2

## 2015-10-24 MED ORDER — LIDOCAINE HCL 2 % IJ SOLN
5.0000 mL | Freq: Once | INTRAMUSCULAR | Status: AC
  Administered 2015-10-24: 100 mg
  Filled 2015-10-24: qty 20

## 2015-10-24 NOTE — ED Notes (Signed)
Patient transported to X-ray 

## 2015-10-24 NOTE — ED Notes (Signed)
Assisted patient with removing her gold toned/green stone ring from her left ring finger. Ring given to friend at bedside. Also, provided ice pack to her left wrist.

## 2015-10-24 NOTE — ED Notes (Signed)
Pt returned from radiology.

## 2015-10-24 NOTE — ED Notes (Signed)
Dr. Alvino Chapel and Gerald Stabs, [place] at bedside. Ortho tech at bedside.

## 2015-10-24 NOTE — ED Provider Notes (Signed)
CSN: [id number]     Arrival date & time 10/24/15  1939 History   First MD Initiated Contact with Patient 10/24/15 1958     Chief Complaint  Patient presents with  . Wrist Injury     (Consider location/radiation/quality/duration/timing/severity/associated sxs/prior Treatment) HPI Patient presents to the emergency department with left injury following a fall.  Patient states she was leaning against a gate when it opened and she fell backwards against her outstretched wrist.  Patient states that she felt immediate pain and deformity in that wrist.  The patient states that movement and palpation make the pain worse.  It was splinted prior to arrival.  She also applied ice.  No medicines were given prior to arrival.  Patient denies numbness, weakness, head injury or loss of consciousness Past Medical History  Diagnosis Date  . Hypothyroidism   . Heart murmur   . Cataracts, bilateral    Past Surgical History  Procedure Laterality Date  . Total knee arthroplasty      x 2  . Tonsillectomy    . Cataract extraction     No family history on file. Social History  Substance Use Topics  . Smoking status: Never Smoker   . Smokeless tobacco: None  . Alcohol Use: Yes     Comment: rarely    OB History    No data available     Review of Systems  All other systems negative except as documented in the HPI. All pertinent positives and negatives as reviewed in the HPI.  Allergies  Penicillins  Home Medications   Prior to Admission medications   Not on File   BP 143/85 mmHg  Pulse 79  Temp(Src) 98.2 F (36.8 C) (Oral)  Resp 18  SpO2 98% Physical Exam  Constitutional: She is oriented to person, place, and time. She appears well-developed and well-nourished. No distress.  HENT:  Head: Normocephalic and atraumatic.  Eyes: Pupils are equal, round, and reactive to light.  Cardiovascular: Normal rate, regular rhythm and normal heart sounds.  Exam reveals no gallop and no friction rub.    No murmur heard. Pulmonary/Chest: Effort normal and breath sounds normal.  Musculoskeletal:       Left wrist: She exhibits decreased range of motion, tenderness, bony tenderness, swelling and deformity. She exhibits no effusion, no crepitus and no laceration.  Neurological: She is alert and oriented to person, place, and time.  Skin: Skin is warm and dry. No rash noted. No erythema.  Nursing note and vitals reviewed.   ED Course  Procedures (including critical care time) Labs Review Labs Reviewed - No data to display  Imaging Review Dg Wrist Complete Left  10/24/2015  CLINICAL DATA:  Status post fall, with pain, swelling and deformity at the left wrist. Initial encounter. EXAM: LEFT WRIST - COMPLETE 3+ VIEW COMPARISON:  None. FINDINGS: There is a comminuted and impacted fracture of the distal radial metaphysis, with dorsal displacement and angulation. A small ulnar styloid fracture is suspected, though not well seen. Surrounding soft tissue swelling is noted. The carpal rows appear grossly intact, and demonstrate normal alignment. IMPRESSION: Comminuted impacted fracture of the distal radial metaphysis, with dorsal displacement and dorsal angulation. Suspect underlying small ulnar styloid fracture. Electronically Signed   By: Garald Balding M.D.   On: 10/24/2015 20:20   I have personally reviewed and evaluated these images and lab results as part of my medical decision-making.   Spoke with Dr. Burney Gauze, hand surgery, who will see the patient in follow-up  and states that he like for her to coordinate with her ophthalmologist is doing cataract surgery tomorrow about whether she can have the cataract surgery before the wrist operation without any complications.  Patient will be splinted here.  Patient the plan and all questions were answered   Dalia Heading, PA-C 10/24/15 2142  Davonna Belling, MD 10/24/15 2314

## 2015-10-24 NOTE — Discharge Instructions (Signed)
Call Dr. Talbert Forest' office in the morning and get their recommendations on whether it is okay to have the cataract surgery and then have surgery on your wrist this or if they want to delay the cataract surgery, Dr. Burney Gauze  advised to have Dr. Talbert Forest if there is any questions

## 2015-10-24 NOTE — ED Notes (Signed)
Pt states that she was standing and leaning against what she thought was a fence and it was a gate; pt states that the gate opened and she fell and caught herself with her left wrist; pt c/o pain and swelling to left wrist area

## 2015-10-25 ENCOUNTER — Encounter (HOSPITAL_BASED_OUTPATIENT_CLINIC_OR_DEPARTMENT_OTHER): Payer: Self-pay | Admitting: *Deleted

## 2015-10-25 ENCOUNTER — Other Ambulatory Visit: Payer: Self-pay | Admitting: Orthopedic Surgery

## 2015-10-25 MED FILL — HYDROCODON-APAP 5-325: 5-325 | 3 days supply | Qty: 15 | Fill #0

## 2015-10-26 NOTE — Anesthesia Preprocedure Evaluation (Addendum)
Anesthesia Evaluation  Patient identified by MRN, date of birth, ID band Patient awake    Reviewed: Allergy & Precautions, NPO status , Patient's Chart, lab work & pertinent test results  Airway Mallampati: II  TM Distance: >3 FB Neck ROM: Full    Dental   Pulmonary neg pulmonary ROS,    breath sounds clear to auscultation       Cardiovascular negative cardio ROS   Rhythm:Regular Rate:Normal     Neuro/Psych Anxiety Depression negative neurological ROS     GI/Hepatic negative GI ROS, Neg liver ROS,   Endo/Other  Hypothyroidism Morbid obesity  Renal/GU negative Renal ROS     Musculoskeletal   Abdominal   Peds  Hematology   Anesthesia Other Findings   Reproductive/Obstetrics                            Anesthesia Physical Anesthesia Plan  ASA: III  Anesthesia Plan: General and Regional   Post-op Pain Management: GA combined w/ Regional for post-op pain   Induction: Intravenous  Airway Management Planned: LMA  Additional Equipment:   Intra-op Plan:   Post-operative Plan: Extubation in OR  Informed Consent: I have reviewed the patients History and Physical, chart, labs and discussed the procedure including the risks, benefits and alternatives for the proposed anesthesia with the patient or authorized representative who has indicated his/her understanding and acceptance.   Dental advisory given  Plan Discussed with: CRNA  Anesthesia Plan Comments:        Anesthesia Quick Evaluation

## 2015-10-27 ENCOUNTER — Encounter (HOSPITAL_BASED_OUTPATIENT_CLINIC_OR_DEPARTMENT_OTHER): Payer: Self-pay | Admitting: Certified Registered"

## 2015-10-27 ENCOUNTER — Ambulatory Visit (HOSPITAL_BASED_OUTPATIENT_CLINIC_OR_DEPARTMENT_OTHER): Payer: 59 | Admitting: Certified Registered"

## 2015-10-27 ENCOUNTER — Ambulatory Visit (HOSPITAL_BASED_OUTPATIENT_CLINIC_OR_DEPARTMENT_OTHER)
Admission: RE | Admit: 2015-10-27 | Discharge: 2015-10-27 | Disposition: A | Discharge status: Home / Self Care | Payer: 59 | Source: Ambulatory Visit | Attending: Orthopedic Surgery | Admitting: Orthopedic Surgery

## 2015-10-27 ENCOUNTER — Encounter (HOSPITAL_BASED_OUTPATIENT_CLINIC_OR_DEPARTMENT_OTHER)
Admission: RE | Disposition: A | Discharge status: Home / Self Care | Payer: Self-pay | Source: Ambulatory Visit | Attending: Orthopedic Surgery

## 2015-10-27 DIAGNOSIS — F329 Major depressive disorder, single episode, unspecified: Secondary | ICD-10-CM | POA: Insufficient documentation

## 2015-10-27 DIAGNOSIS — J302 Other seasonal allergic rhinitis: Secondary | ICD-10-CM | POA: Diagnosis not present

## 2015-10-27 DIAGNOSIS — S52572A Other intraarticular fracture of lower end of left radius, initial encounter for closed fracture: Secondary | ICD-10-CM | POA: Diagnosis not present

## 2015-10-27 DIAGNOSIS — W19XXXA Unspecified fall, initial encounter: Secondary | ICD-10-CM | POA: Insufficient documentation

## 2015-10-27 DIAGNOSIS — G5602 Carpal tunnel syndrome, left upper limb: Secondary | ICD-10-CM | POA: Diagnosis not present

## 2015-10-27 DIAGNOSIS — E039 Hypothyroidism, unspecified: Secondary | ICD-10-CM | POA: Diagnosis not present

## 2015-10-27 DIAGNOSIS — M25532 Pain in left wrist: Secondary | ICD-10-CM | POA: Diagnosis not present

## 2015-10-27 DIAGNOSIS — Z6841 Body Mass Index (BMI) 40.0 and over, adult: Secondary | ICD-10-CM | POA: Diagnosis not present

## 2015-10-27 DIAGNOSIS — S62122A Displaced fracture of lunate [semilunar], left wrist, initial encounter for closed fracture: Secondary | ICD-10-CM | POA: Diagnosis not present

## 2015-10-27 DIAGNOSIS — S62002A Unspecified fracture of navicular [scaphoid] bone of left wrist, initial encounter for closed fracture: Secondary | ICD-10-CM | POA: Insufficient documentation

## 2015-10-27 DIAGNOSIS — Z79899 Other long term (current) drug therapy: Secondary | ICD-10-CM | POA: Insufficient documentation

## 2015-10-27 DIAGNOSIS — S52502A Unspecified fracture of the lower end of left radius, initial encounter for closed fracture: Secondary | ICD-10-CM | POA: Diagnosis not present

## 2015-10-27 DIAGNOSIS — G8918 Other acute postprocedural pain: Secondary | ICD-10-CM | POA: Diagnosis not present

## 2015-10-27 HISTORY — PX: OPEN REDUCTION INTERNAL FIXATION (ORIF) DISTAL RADIAL FRACTURE: SHX5989

## 2015-10-27 HISTORY — DX: Depression, unspecified: F32.A

## 2015-10-27 HISTORY — DX: Other seasonal allergic rhinitis: J30.2

## 2015-10-27 HISTORY — DX: Major depressive disorder, single episode, unspecified: F32.9

## 2015-10-27 HISTORY — PX: CARPAL TUNNEL RELEASE: SHX101

## 2015-10-27 HISTORY — DX: Unspecified fracture of the lower end of left radius, initial encounter for closed fracture: S52.502A

## 2015-10-27 HISTORY — DX: Anxiety disorder, unspecified: F41.9

## 2015-10-27 SURGERY — OPEN REDUCTION INTERNAL FIXATION (ORIF) DISTAL RADIUS FRACTURE
Anesthesia: Regional | Site: Wrist | Laterality: Left

## 2015-10-27 MED ORDER — ONDANSETRON HCL 4 MG/2ML IJ SOLN
INTRAMUSCULAR | Status: AC
  Filled 2015-10-27: qty 2

## 2015-10-27 MED ORDER — OXYCODONE-ACETAMINOPHEN 5-325 MG PO TABS: 1.0000 | ORAL_TABLET | ORAL | Status: DC | PRN

## 2015-10-27 MED ORDER — LACTATED RINGERS IV SOLN
INTRAVENOUS | Status: DC
Start: 2015-10-27 — End: 2015-10-27
  Administered 2015-10-27 (×2): via INTRAVENOUS

## 2015-10-27 MED ORDER — SCOPOLAMINE 1 MG/3DAYS TD PT72: 1.0000 | MEDICATED_PATCH | Freq: Once | TRANSDERMAL | Status: DC | PRN

## 2015-10-27 MED ORDER — 0.9 % SODIUM CHLORIDE (POUR BTL) OPTIME
TOPICAL | Status: DC | PRN
  Administered 2015-10-27: 200 mL

## 2015-10-27 MED ORDER — PROPOFOL 10 MG/ML IV BOLUS
INTRAVENOUS | Status: AC
  Filled 2015-10-27: qty 20

## 2015-10-27 MED ORDER — DEXAMETHASONE SODIUM PHOSPHATE 10 MG/ML IJ SOLN
INTRAMUSCULAR | Status: AC
  Filled 2015-10-27: qty 1

## 2015-10-27 MED ORDER — FENTANYL CITRATE (PF) 100 MCG/2ML IJ SOLN
INTRAMUSCULAR | Status: AC
  Filled 2015-10-27: qty 2

## 2015-10-27 MED ORDER — CHLORHEXIDINE GLUCONATE 4 % EX LIQD: 60.0000 mL | Freq: Once | CUTANEOUS | Status: DC

## 2015-10-27 MED ORDER — BUPIVACAINE HCL (PF) 0.25 % IJ SOLN
INTRAMUSCULAR | Status: AC
  Filled 2015-10-27: qty 30

## 2015-10-27 MED ORDER — GLYCOPYRROLATE 0.2 MG/ML IJ SOLN: 0.2000 mg | Freq: Once | INTRAMUSCULAR | Status: DC | PRN

## 2015-10-27 MED ORDER — PROPOFOL 500 MG/50ML IV EMUL
INTRAVENOUS | Status: AC
  Filled 2015-10-27: qty 50

## 2015-10-27 MED ORDER — LIDOCAINE HCL (CARDIAC) 20 MG/ML IV SOLN
INTRAVENOUS | Status: DC | PRN
  Administered 2015-10-27: 30 mg via INTRAVENOUS

## 2015-10-27 MED ORDER — DEXAMETHASONE SODIUM PHOSPHATE 10 MG/ML IJ SOLN
INTRAMUSCULAR | Status: DC | PRN
  Administered 2015-10-27: 10 mg via INTRAVENOUS

## 2015-10-27 MED ORDER — VANCOMYCIN HCL 10 G IV SOLR
1500.0000 mg | INTRAVENOUS | Status: AC
  Administered 2015-10-27: 1500 mg via INTRAVENOUS

## 2015-10-27 MED ORDER — BUPIVACAINE-EPINEPHRINE (PF) 0.5% -1:200000 IJ SOLN
INTRAMUSCULAR | Status: DC | PRN
  Administered 2015-10-27: 30 mL via PERINEURAL

## 2015-10-27 MED ORDER — ONDANSETRON HCL 4 MG/2ML IJ SOLN
INTRAMUSCULAR | Status: DC | PRN
  Administered 2015-10-27: 4 mg via INTRAVENOUS

## 2015-10-27 MED ORDER — MIDAZOLAM HCL 2 MG/2ML IJ SOLN
INTRAMUSCULAR | Status: AC
  Filled 2015-10-27: qty 2

## 2015-10-27 MED ORDER — FENTANYL CITRATE (PF) 100 MCG/2ML IJ SOLN
50.0000 ug | INTRAMUSCULAR | Status: DC | PRN
  Administered 2015-10-27: 100 ug via INTRAVENOUS

## 2015-10-27 MED ORDER — LIDOCAINE 2% (20 MG/ML) 5 ML SYRINGE
INTRAMUSCULAR | Status: AC
  Filled 2015-10-27: qty 5

## 2015-10-27 MED ORDER — MIDAZOLAM HCL 2 MG/2ML IJ SOLN
1.0000 mg | INTRAMUSCULAR | Status: DC | PRN
  Administered 2015-10-27: 2 mg via INTRAVENOUS

## 2015-10-27 MED ORDER — PROPOFOL 10 MG/ML IV BOLUS
INTRAVENOUS | Status: DC | PRN
  Administered 2015-10-27: 150 mg via INTRAVENOUS

## 2015-10-27 MED FILL — OXYCODONE/APAP 5-325: 5-325 | 5 days supply | Qty: 30 | Fill #0

## 2015-10-27 SURGICAL SUPPLY — 80 items
APL SKNCLS STERI-STRIP NONHPOA (GAUZE/BANDAGES/DRESSINGS) ×1
BANDAGE ACE 3X5.8 VEL STRL LF (GAUZE/BANDAGES/DRESSINGS) IMPLANT
BANDAGE ACE 4X5 VEL STRL LF (GAUZE/BANDAGES/DRESSINGS) ×3 IMPLANT
BENZOIN TINCTURE PRP APPL 2/3 (GAUZE/BANDAGES/DRESSINGS) ×3 IMPLANT
BIT DRILL 2 FAST STEP (BIT) ×2 IMPLANT
BIT DRILL 2.5X4 QC (BIT) ×2 IMPLANT
BLADE MINI RND TIP GREEN BEAV (BLADE) IMPLANT
BLADE SURG 15 STRL LF DISP TIS (BLADE) ×2 IMPLANT
BLADE SURG 15 STRL SS (BLADE) ×6
BNDG CMPR 9X4 STRL LF SNTH (GAUZE/BANDAGES/DRESSINGS) ×1
BNDG ESMARK 4X9 LF (GAUZE/BANDAGES/DRESSINGS) ×2 IMPLANT
BNDG GAUZE ELAST 4 BULKY (GAUZE/BANDAGES/DRESSINGS) ×3 IMPLANT
CANISTER SUCT 1200ML W/VALVE (MISCELLANEOUS) IMPLANT
CLOSURE WOUND 1/2 X4 (GAUZE/BANDAGES/DRESSINGS) ×1
CORDS BIPOLAR (ELECTRODE) ×3 IMPLANT
COVER BACK TABLE 60X90IN (DRAPES) ×3 IMPLANT
CUFF TOURNIQUET SINGLE 18IN (TOURNIQUET CUFF) ×2 IMPLANT
DECANTER SPIKE VIAL GLASS SM (MISCELLANEOUS) IMPLANT
DRAPE EXTREMITY T 121X128X90 (DRAPE) ×3 IMPLANT
DRAPE OEC MINIVIEW 54X84 (DRAPES) ×3 IMPLANT
DRAPE SURG 17X23 STRL (DRAPES) ×3 IMPLANT
DURAPREP 26ML APPLICATOR (WOUND CARE) ×3 IMPLANT
ELECT REM PT RETURN 9FT ADLT (ELECTROSURGICAL)
ELECTRODE REM PT RTRN 9FT ADLT (ELECTROSURGICAL) IMPLANT
GAUZE SPONGE 4X4 12PLY STRL (GAUZE/BANDAGES/DRESSINGS) ×3 IMPLANT
GAUZE SPONGE 4X4 16PLY XRAY LF (GAUZE/BANDAGES/DRESSINGS) IMPLANT
GAUZE XEROFORM 1X8 LF (GAUZE/BANDAGES/DRESSINGS) IMPLANT
GLOVE BIO SURGEON STRL SZ 6.5 (GLOVE) ×1 IMPLANT
GLOVE BIO SURGEONS STRL SZ 6.5 (GLOVE) ×1
GLOVE BIOGEL M STRL SZ7.5 (GLOVE) ×2 IMPLANT
GLOVE BIOGEL PI IND STRL 7.0 (GLOVE) IMPLANT
GLOVE BIOGEL PI INDICATOR 7.0 (GLOVE) ×4
GLOVE SURG SYN 8.0 (GLOVE) ×6 IMPLANT
GLOVE SURG SYN 8.0 PF PI (GLOVE) ×2 IMPLANT
GOWN STRL REUS W/ TWL LRG LVL3 (GOWN DISPOSABLE) ×1 IMPLANT
GOWN STRL REUS W/TWL LRG LVL3 (GOWN DISPOSABLE) ×3
GOWN STRL REUS W/TWL XL LVL3 (GOWN DISPOSABLE) ×6 IMPLANT
NDL HYPO 25X1 1.5 SAFETY (NEEDLE) ×1 IMPLANT
NEEDLE HYPO 25X1 1.5 SAFETY (NEEDLE) ×3 IMPLANT
NS IRRIG 1000ML POUR BTL (IV SOLUTION) ×3 IMPLANT
PACK BASIN DAY SURGERY FS (CUSTOM PROCEDURE TRAY) ×3 IMPLANT
PAD CAST 3X4 CTTN HI CHSV (CAST SUPPLIES) ×1 IMPLANT
PAD CAST 4YDX4 CTTN HI CHSV (CAST SUPPLIES) IMPLANT
PADDING CAST ABS 4INX4YD NS (CAST SUPPLIES) ×2
PADDING CAST ABS COTTON 4X4 ST (CAST SUPPLIES) ×1 IMPLANT
PADDING CAST COTTON 3X4 STRL (CAST SUPPLIES) ×3
PADDING CAST COTTON 4X4 STRL (CAST SUPPLIES)
PEG SUBCHONDRAL SMOOTH 2.0X20 (Peg) ×6 IMPLANT
PEG SUBCHONDRAL SMOOTH 2.0X22 (Peg) ×8 IMPLANT
PENCIL BUTTON HOLSTER BLD 10FT (ELECTRODE) IMPLANT
PLATE STAN 24.4X59.5 LT (Plate) ×2 IMPLANT
SCREW BN 12X3.5XNS CORT TI (Screw) IMPLANT
SCREW CORT 3.5X10 LNG (Screw) ×2 IMPLANT
SCREW CORT 3.5X12 (Screw) ×6 IMPLANT
SHEET MEDIUM DRAPE 40X70 STRL (DRAPES) ×3 IMPLANT
SLEEVE SCD COMPRESS KNEE MED (MISCELLANEOUS) ×3 IMPLANT
SPLINT PLASTER CAST XFAST 3X15 (CAST SUPPLIES) IMPLANT
SPLINT PLASTER CAST XFAST 4X15 (CAST SUPPLIES) ×5 IMPLANT
SPLINT PLASTER XTRA FAST SET 4 (CAST SUPPLIES) ×10
SPLINT PLASTER XTRA FASTSET 3X (CAST SUPPLIES)
STOCKINETTE 4X48 STRL (DRAPES) ×3 IMPLANT
STRIP CLOSURE SKIN 1/2X4 (GAUZE/BANDAGES/DRESSINGS) ×2 IMPLANT
SUCTION FRAZIER HANDLE 10FR (MISCELLANEOUS)
SUCTION TUBE FRAZIER 10FR DISP (MISCELLANEOUS) IMPLANT
SUT CHROMIC 3 0 PS 2 (SUTURE) IMPLANT
SUT ETHILON 4 0 PS 2 18 (SUTURE) IMPLANT
SUT MERSILENE 4 0 P 3 (SUTURE) IMPLANT
SUT PROLENE 3 0 PS 2 (SUTURE) ×3 IMPLANT
SUT VIC AB 0 SH 27 (SUTURE) IMPLANT
SUT VIC AB 2-0 PS2 27 (SUTURE) IMPLANT
SUT VIC AB 3-0 FS2 27 (SUTURE) IMPLANT
SUT VIC AB 4-0 RB1 18 (SUTURE) IMPLANT
SUT VICRYL RAPIDE 4-0 (SUTURE) IMPLANT
SUT VICRYL RAPIDE 4/0 PS 2 (SUTURE) IMPLANT
SYR BULB 3OZ (MISCELLANEOUS) ×3 IMPLANT
SYRINGE 10CC LL (SYRINGE) ×3 IMPLANT
TOWEL OR 17X24 6PK STRL BLUE (TOWEL DISPOSABLE) ×3 IMPLANT
TUBE CONNECTING 20'X1/4 (TUBING)
TUBE CONNECTING 20X1/4 (TUBING) IMPLANT
UNDERPAD 30X30 (UNDERPADS AND DIAPERS) ×3 IMPLANT

## 2015-10-27 NOTE — Discharge Instructions (Signed)
°  Post Anesthesia Home Care Instructions ° °Activity: °Get plenty of rest for the remainder of the day. A responsible adult should stay with you for 24 hours following the procedure.  °For the next 24 hours, DO NOT: °-Drive a car °-Operate machinery °-Drink alcoholic beverages °-Take any medication unless instructed by your physician °-Make any legal decisions or sign important papers. ° °Meals: °Start with liquid foods such as gelatin or soup. Progress to regular foods as tolerated. Avoid greasy, spicy, heavy foods. If nausea and/or vomiting occur, drink only clear liquids until the nausea and/or vomiting subsides. Call your physician if vomiting continues. ° °Special Instructions/Symptoms: °Your throat may feel dry or sore from the anesthesia or the breathing tube placed in your throat during surgery. If this causes discomfort, gargle with warm salt water. The discomfort should disappear within 24 hours. ° °If you had a scopolamine patch placed behind your ear for the management of post- operative nausea and/or vomiting: ° °1. The medication in the patch is effective for 72 hours, after which it should be removed.  Wrap patch in a tissue and discard in the trash. Wash hands thoroughly with soap and water. °2. You may remove the patch earlier than 72 hours if you experience unpleasant side effects which may include dry mouth, dizziness or visual disturbances. °3. Avoid touching the patch. Wash your hands with soap and water after contact with the patch. °  °Regional Anesthesia Blocks ° °1. Numbness or the inability to move the "blocked" extremity may last from 3-48 hours after placement. The length of time depends on the medication injected and your individual response to the medication. If the numbness is not going away after 48 hours, call your surgeon. ° °2. The extremity that is blocked will need to be protected until the numbness is gone and the  Strength has returned. Because you cannot feel it, you will need  to take extra care to avoid injury. Because it may be weak, you may have difficulty moving it or using it. You may not know what position it is in without looking at it while the block is in effect. ° °3. For blocks in the legs and feet, returning to weight bearing and walking needs to be done carefully. You will need to wait until the numbness is entirely gone and the strength has returned. You should be able to move your leg and foot normally before you try and bear weight or walk. You will need someone to be with you when you first try to ensure you do not fall and possibly risk injury. ° °4. Bruising and tenderness at the needle site are common side effects and will resolve in a few days. ° °5. Persistent numbness or new problems with movement should be communicated to the surgeon or the [facility] ([phone])/ [facility] ([phone]). °

## 2015-10-27 NOTE — Op Note (Signed)
See note

## 2015-10-27 NOTE — Op Note (Signed)
See note [phone]

## 2015-10-27 NOTE — Transfer of Care (Signed)
Immediate Anesthesia Transfer of Care Note  Patient: Belinda [name]  Procedure(s) Performed: Procedure(s): OPEN REDUCTION INTERNAL FIXATION (ORIF) LEFT  DISTAL RADIAL FRACTURE (Left) LEFT CARPAL TUNNEL RELEASE (Left)  Patient Location: PACU  Anesthesia Type:GA combined with regional for post-op pain  Level of Consciousness: awake and patient cooperative  Airway & Oxygen Therapy: Patient Spontanous Breathing and Patient connected to face mask oxygen  Post-op Assessment: Report given to RN and Post -op Vital signs reviewed and stable  Post vital signs: Reviewed and stable  Last Vitals:  Filed Vitals:   10/27/15 0813 10/27/15 0930  BP:    Pulse: 73 80  Temp:    Resp: 21     Last Pain:  Filed Vitals:   10/27/15 0932  PainSc: 9       Patients Stated Pain Goal: 5 ([id number])  Complications: No apparent anesthesia complications

## 2015-10-27 NOTE — Progress Notes (Signed)
Assisted Dr. Rob Fitzgerald with left, ultrasound guided, supraclavicular block. Side rails up, monitors on throughout procedure. See vital signs in flow sheet. Tolerated Procedure well. 

## 2015-10-27 NOTE — Anesthesia Procedure Notes (Addendum)
Anesthesia Regional Block:  Supraclavicular block  Pre-Anesthetic Checklist: ,, timeout performed, Correct Patient, Correct Site, Correct Laterality, Correct Procedure, Correct Position, site marked, Risks and benefits discussed,  Surgical consent,  Pre-op evaluation,  At surgeon's request and post-op pain management  Laterality: Left  Prep: chloraprep       Needles:  Injection technique: Single-shot  Needle Type: Echogenic Stimulator Needle     Needle Length: 9cm 9 cm Needle Gauge: 21 and 21 G    Additional Needles:  Procedures: ultrasound guided (picture in chart) and nerve stimulator Supraclavicular block  Nerve Stimulator or Paresthesia:  Response: deltoid, triceps, biceps, 0.5 mA,   Additional Responses:   Narrative:  Start time: 10/27/2015 8:00 AM End time: 10/27/2015 8:10 AM Injection made incrementally with aspirations every 5 mL.  Performed by: Personally  Anesthesiologist: Suzette Battiest  Additional Notes: Risks, benefits and alternative to block explained extensively.  Patient tolerated procedure well, without complications.   Procedure Name: LMA Insertion Date/Time: 10/27/2015 8:29 AM Performed by: Glynda Soliday D Pre-anesthesia Checklist: Patient identified, Emergency Drugs available, Suction available and Patient being monitored Patient Re-evaluated:Patient Re-evaluated prior to inductionOxygen Delivery Method: Circle system utilized Preoxygenation: Pre-oxygenation with 100% oxygen Intubation Type: IV induction Ventilation: Mask ventilation without difficulty LMA: LMA inserted LMA Size: 4.0 Number of attempts: 1 Airway Equipment and Method: Bite block Placement Confirmation: positive ETCO2 Tube secured with: Tape Dental Injury: Teeth and Oropharynx as per pre-operative assessment

## 2015-10-27 NOTE — Anesthesia Postprocedure Evaluation (Signed)
Anesthesia Post Note  Patient: Belinda [name]  Procedure(s) Performed: Procedure(s) (LRB): OPEN REDUCTION INTERNAL FIXATION (ORIF) LEFT  DISTAL RADIAL FRACTURE (Left) LEFT CARPAL TUNNEL RELEASE (Left)  Patient location during evaluation: PACU Anesthesia Type: General Level of consciousness: awake and alert Pain management: pain level controlled Vital Signs Assessment: post-procedure vital signs reviewed and stable Respiratory status: spontaneous breathing, nonlabored ventilation, respiratory function stable and patient connected to nasal cannula oxygen Cardiovascular status: blood pressure returned to baseline and stable Postop Assessment: no signs of nausea or vomiting Anesthetic complications: no    Last Vitals:  Filed Vitals:   10/27/15 1000 10/27/15 1017  BP:  137/66  Pulse: 87 87  Temp:  37 C  Resp: 14 18    Last Pain:  Filed Vitals:   10/27/15 1019  PainSc: 0-No pain                 Tiajuana Amass

## 2015-10-27 NOTE — H&P (Signed)
Belinda [name] is an 65 y.o. female.   Chief Complaint: left wrist pain and swelling s/p fall HPI: as above s/p foosh onto left wrist with displaced distal radius fracture and median symptoms  Past Medical History  Diagnosis Date  . Hypothyroidism   . Cataracts, bilateral   . 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 Seasonal allergies   . Depression   . Anxiety   . Distal radius fracture, left     Past Surgical History  Procedure Laterality Date  . Total knee arthroplasty      x 2  . Tonsillectomy    . Cataract extraction    . Joint replacement Bilateral     History reviewed. No pertinent family history. Social History:  reports that she has never smoked. She does not have any smokeless tobacco history on file. She reports that she drinks alcohol. She reports that she does not use illicit drugs.  Allergies:  Allergies  Allergen Reactions  . Penicillins Rash    Medications Prior to Admission  Medication Sig Dispense Refill  . cetirizine (ZYRTEC) 10 MG tablet Take 10 mg by mouth daily.    . Cholecalciferol (VITAMIN D3) 5000 units CAPS Take by mouth 2 (two) times daily.    Marland Kitchen HYDROcodone-acetaminophen (NORCO/VICODIN) 5-325 MG tablet Take 1 tablet by mouth every 6 (six) hours as needed for moderate pain. 15 tablet 0  . levothyroxine (SYNTHROID, LEVOTHROID) 200 MCG tablet Take 200 mcg by mouth daily before breakfast.    . nystatin ointment (MYCOSTATIN) Apply 1 application topically 2 (two) times daily.    . sertraline (ZOLOFT) 100 MG tablet Take 100 mg by mouth daily.      No results found for this or any previous visit (from the past 48 hour(s)). No results found.  Review of Systems  All other systems reviewed and are negative.   Blood pressure 99/56, pulse 73, temperature 98.2 F (36.8 C), temperature source Oral, resp. rate 21, height 5\' 5"  (1.651 m), weight 117.028 kg (258 lb), SpO2 99 %. Physical Exam  Constitutional: She appears well-developed and  well-nourished.  HENT:  Head: Normocephalic and atraumatic.  Neck: Normal range of motion.  Cardiovascular: Normal rate.   Respiratory: Effort normal.  Musculoskeletal:       Left wrist: She exhibits tenderness, bony tenderness, swelling and deformity.  Displaced left distal radius fracture with intermittent median nerve symtoms  Neurological: She is alert.  Skin: Skin is warm.  Psychiatric: She has a normal mood and affect. Her behavior is normal. Judgment and thought content normal.     Assessment/Plan As above  Plan orif above with ctr  Schuyler Amor, MD 10/27/2015, 8:21 AM

## 2015-10-28 ENCOUNTER — Encounter (HOSPITAL_BASED_OUTPATIENT_CLINIC_OR_DEPARTMENT_OTHER): Payer: Self-pay | Admitting: Orthopedic Surgery

## 2015-10-28 NOTE — Op Note (Signed)
NAMEADAMARIE, Belinda [name] NO.:  [id number]  MEDICAL RECORD NO.:  [id number]  LOCATION:                                 FACILITY:  PHYSICIAN:  Sheral Apley. Tressie Ragin, M.D.DATE OF BIRTH:  Sep 13, 1950  DATE OF PROCEDURE:  10/27/2015 DATE OF DISCHARGE:                              OPERATIVE REPORT   LOCATION:  [facility].  PREOPERATIVE DIAGNOSES:  Displaced three-part intra-articular fracture distal radius left side with left median nerve symptomatology, numbness and tingling to the thumb, index, long finger consistent with acute carpal tunnel syndrome.  POSTOPERATIVE DIAGNOSES:  Displaced three-part intra-articular fracture distal radius left side with left median nerve symptomatology, numbness, and tingling to the thumb, index, long finger consistent with acute carpal tunnel syndrome.  PROCEDURE:  Open reduction, internal fixation, displaced 3 part intra- articular fracture, distal radius, left side with left median nerve decompression and release of carpal tunnel.  SURGEON:  Sheral Apley. Burney Gauze, M.D.  ASSISTANT:  Julian Reil, P.A.  ANESTHESIA:  Axillary block and general.  COMPLICATION:  No complications.  DRAINS:  No drains.  DESCRIPTION OF PROCEDURE:  The patient was taken to the operating suite. After induction of adequate general anesthetic, the left upper extremity was prepped and draped in usual sterile fashion.  An Esmarch was used to exsanguinate the limb.  Tourniquet was then inflated to 250 mmHg.  At this point in time, the incision was made in the palmar aspect of the left distal forearm wrist area.  Skin incised sharply over the palpable border of the flexor carpi radialis tendon and the skin was incised, the sheath was identified.  The sheath overlying the FCR was incised, retracted to the midline and radial artery to lateral side, fascia was split and dissection was carried down the pronator quadratus.   We subperiosteally stripped the pronator quadratus off the fracture site revealing a 3 part intra-articular fracture of distal radius left side. We released the brachioradialis tendon off the radial styloid fragment to aid in reduction.  Then with a combination of flexion, ulnar deviation and slight traction we reduced the fracture.  Using fluoroscopic imaging, we placed a standard DVR plate volarly and fixed it though the slotted hole.  We then used fluoroscopy to determine adequate plate position.  Once this was done, we secured the 2 more proximal screws and the smooth pegs distally under direct and fluoroscopic guidance.  Intraoperative fluoroscopy revealed adequate reduction in AP, lateral, and oblique view.  We identified the median nerve proximally and traced it to the edge of the carpal canal.  We identified and protected the palmar cutaneous branch of the median nerve.  We then created a path dorsal and volar to the transverse carpal ligament and divided from proximal to distal decompressing the nerve. We irrigated. We loosely covered the plate with the pronator quadratus using 2-0 undyed Vicryl, 4-0 Vicryl was used subcutaneously and 3-0 Prolene subcuticular stitch was used on the skin.  Steri-Strips, 4x4s fluffs and a volar splint were applied.  The patient tolerated the procedure well and went to recovery room in stable fashion.     Sheral Apley Burney Gauze, M.D.   ______________________________  Sheral Apley Burney Gauze, M.D.    MAW/MEDQ  D:  10/27/2015  T:  10/28/2015  Job:  [id number]

## 2015-10-28 NOTE — Op Note (Signed)
NAMEBELISA, KASSAM NO.:  [id number]  MEDICAL RECORD NO.:  [id number]  LOCATION:  WA05                         FACILITY:  [facility]  PHYSICIAN:  Sheral Apley. Hayk Divis, M.D.DATE OF BIRTH:  1950-09-22  DATE OF PROCEDURE:  10/27/2015 DATE OF DISCHARGE:  10/24/2015                              OPERATIVE REPORT   PREOPERATIVE DIAGNOSIS:  Displaced intra-articular fracture, 3-part, distal radius left side with median nerve compression with numbness and tingling in the thumb, index, and long finger consistent with acute carpal tunnel syndrome.  POSTOPERATIVE DIAGNOSIS:  Displaced intra-articular fracture, 3-part, distal radius left side with median nerve compression with numbness and tingling in the thumb, index, and long finger consistent with acute carpal tunnel syndrome.  PROCEDURE:  Open reduction and internal fixation, 3 part intra-articular distal radius fracture, left side with carpal tunnel release and decompression of median nerve.  SURGEON:  Sheral Apley. Burney Gauze, MD  ASSISTANT:  Marily Lente Dasnoit, PA  ANESTHESIA:  Axillary block and general.  COMPLICATIONS:  None.  DRAINS:  None.  PROCEDURE IN DETAIL:  The patient was taken to the operating suite after induction of adequate general anesthetic and axillary block analgesia. Left upper extremity was prepped and draped in usual sterile fashion. An Esmarch was used to exsanguinate the limb.  The tourniquet was inflated to 250 mmHg.  At this point in time, incision was made over the palpable border of flexor carpi radialis tendon.  Distal forearm wrist area, left side, skin was incised sharply.  The sheath overlying the FCR was incised.  The FCR was retracted to the midline, radial artery to the lateral side.  The fascia underlying this was incised.  Dissection was carried down to the level of pronator quadratus.  This is subperiosteal, stripped off the distal radius revealing a 3-part  intra-articular fracture distal radius.  We released brachioradialis off the radial styloid fragment to aid in reduction.  Using a combination __________ deviation, reduced the fracture, we then under fluoroscopic imaging and direct visualization placed a standard DVR plate volarly to the slotted hole.  We placed 1 cortical screw.  We then used fluoroscopy to determine adequate plate position.  We placed 2 more cortical screws proximally followed by the smooth pegs x7 distally.  We then used fluoroscopy to determine adequate plate position and reduction of the fracture.  Once this was done, we identified the median nerve proximally and traced the edge of the carpal canal.  We created a path dorsal and volar to the transverse carpal ligament.  We released this from proximal and distal decompressing the nerve.  We thoroughly irrigated and loosely closed the pronator quadratus over the plate with 2-0 undyed Vicryl, 4-0 Vicryl subcutaneously, and a 3-0 Prolene subcuticular stitch on the skin. Steri-Strips, 4x4s, fluffs, and a compressive dressing was applied as well as the volar splint.  The patient tolerated the procedure well and went to recovery room in stable fashion.     Sheral Apley Burney Gauze, M.D.     MAW/MEDQ  D:  10/27/2015  T:  10/28/2015  Job:  [id number]

## 2015-10-28 NOTE — Op Note (Signed)
[name], [name] NO.:  [id number]  MEDICAL RECORD NO.:  [id number]  LOCATION:                                 FACILITY:  PHYSICIAN:  Sheral Apley. Zaidy Absher, M.D.DATE OF BIRTH:  May 04, 1950  DATE OF PROCEDURE:  10/27/2015 DATE OF DISCHARGE:                              OPERATIVE REPORT   PREOPERATIVE DIAGNOSIS:  Complex intra-articular fracture, three-part distal radius, left side, with median nerve symptomatology, numbness and tingling in the thumb, index, and long finger.  POSTOPERATIVE DIAGNOSIS:  Complex intra-articular fracture, three-part distal radius, left side, with median nerve symptomatology, numbness and tingling in the thumb, index, and long finger.  PROCEDURE:  Open reduction and internal fixation of displaced three-part intra-articular fracture of left distal radius with median nerve decompression and carpal tunnel release.  SURGEON:  Sheral Apley. Burney Gauze, M.D.  ASSISTANT:  Julian Reil, P.A.  ANESTHESIA:  Axillary block and general.  COMPLICATION:  No complications.  DRAINS:  No drains.  DESCRIPTION OF PROCEDURE:  The patient was taken to the operating suite. After induction of axillary block analgesia and general laryngeal mask airway anesthetic, the left upper extremity was prepped and draped in the usual sterile fashion.  An Esmarch was used to exsanguinate the limb.  The tourniquet was inflated to 250 mmHg.  At this point in time, an incision was made over the palpable border of the flexor carpi radialis tendon.  Skin was incised 6-8 cm, sheath was incised.  FCR was retracted to the midline, radial artery to the lateral side.  Fascia underlying this was incised exposing the pronator quadratus.  The subperiosteal stripped off the distal radius revealing a three-part intra-articular fracture of the distal radius, the lunate, and scaphoid facet fragments.  We released brachioradialis off the radial styloid fragment to ease in  reduction.  Reduction was performed with longitudinal traction, ulnar deviation, and flexion.  We then placed a standard DVR plate on the volar aspect of the distal radius and under fluoroscopic imaging, determined adequate plate position.  We placed 2 more cortical screws proximally, followed by the smooth pegs distally x7.  Intraoperative fluoroscopy, AP, lateral, and obliques views showed adequate reduction in AP, lateral, and oblique views.  The wound was thoroughly irrigated.  We identified the median nerve proximally, traced it to the edge of the transverse carpal ligament.  We identified and protected the palmar cutaneous branch of the median nerve.  We then released the transverse carpal ligament from proximal to distal, releasing the pressure on the nerve.  The wound was irrigated.  We then loosely closed the pronator quadratus over the plate using 2-0 undyed Vicryl subcutaneously and a 3-0 Prolene subcuticular stitch on the skin.  Steri-Strips, 4x4s, fluffs, and a volar splint was applied.  The patient tolerated the procedure well and went to recovery room in stable fashion.     Sheral Apley Burney Gauze, M.D.   ______________________________ Sheral Apley. Burney Gauze, M.D.    MAW/MEDQ  D:  10/27/2015  T:  10/28/2015  Job:  [id number]

## 2015-10-28 NOTE — Op Note (Signed)
Belinda [name], Belinda [name] NO.:  [id number]  MEDICAL RECORD NO.:  [id number]  LOCATION:  WA05                         FACILITY:  [facility]  PHYSICIAN:  Sheral Apley. Lincoln Kleiner, M.D.DATE OF BIRTH:  1951/04/10  DATE OF PROCEDURE:  10/27/2015 DATE OF DISCHARGE:  10/24/2015                              OPERATIVE REPORT   PREOPERATIVE DIAGNOSIS:  Displaced intra-articular three-part distal radius fracture, left side, with median nerve symptomatology with numbness and tingling of thumb, index, and long finger.  POSTOPERATIVE DIAGNOSIS:  Displaced intra-articular three-part distal radius fracture, left side, with median nerve symptomatology with numbness and tingling of thumb, index, and long finger.  PROCEDURE:  Open reduction and internal fixation of displaced intra- articular fracture, three-part, distal radius, left side, with DVR plate and screws.  Decompression of median nerve.  Carpal tunnel release, left side.  SURGEON:  Sheral Apley. Burney Gauze, M.D.  ASSISTANT:  Marily Lente Dasnoit, PA.  ANESTHESIA:  Axillary block and general.  COMPLICATION:  No complication.  DRAINS:  No drains.  DESCRIPTION OF PROCEDURE:  The patient was taken to the operating suite. After induction of adequate axillary block analgesia and then general laryngeal mask airway anesthetic, the left upper extremity was prepped and draped in the usual sterile fashion.  An Esmarch was used to exsanguinate the limb.  The tourniquet was inflated to 250 mmHg.  At this point in time, incision was made over the palmar aspect of the left wrist centered over the flexor carpi radialis tendon sheath.  The skin was incised sharply.  Dissection was carried down to the sheath overlying the flexor carpi radialis tendon.  The sheath was incised. The FCR was retracted to the midline, the radial artery to the lateral side.  The fascia was incised over the pronator quadratus muscle.  We subperiosteally stripped the  pronator quadratus off the distal radius revealing an intra-articular three-part fracture with a radial styloid fragment and a lunate facet fragment.  We carefully released the brachioradialis off the radial styloid fragment using reduction.  With longitudinal traction, flexion, ulnar deviation, reduced the fracture. We then placed a standard DVR plate on the volar aspect of the distal radius, and under direct and fluoroscopic imaging, we placed the screw 12 mm in the slotted hole.  We then used the fluoroscopy unit to determine adequate plate position.  Once it was determined, 2 more cortical screws were placed proximally, followed by smooth pegs distally x7.  Intraoperative fluoroscopy, AP, lateral, and oblique view revealed near-anatomic reduction.  The wound was thoroughly irrigated.  We identified the median nerve in the proximal aspect of the wound tracing to the edge of the carpal canal.  We carefully identified and retracted the palmar cutaneous branch of the median nerve.  We created a path dorsal and volar to the transverse carpal ligament and with a Soil scientist protecting the median nerve, we released from proximal to distal decompressing the nerve.  The wound was thoroughly irrigated.  We then closed loosely with 2-0 undyed Vicryl to cover the plate with the pronator quadratus, 2-0 undyed Vicryl subcutaneously and a 3-0 Prolene subcuticular stitch on the skin.  Steri- Strips, 4x4s, fluffs and a volar splint was  applied.  The patient tolerated all the procedures well and went to recovery in stable fashion.     Sheral Apley Burney Gauze, M.D.     MAW/MEDQ  D:  10/27/2015  T:  10/28/2015  Job:  [id number]

## 2015-11-02 DIAGNOSIS — S52572A Other intraarticular fracture of lower end of left radius, initial encounter for closed fracture: Secondary | ICD-10-CM | POA: Diagnosis not present

## 2015-11-02 DIAGNOSIS — S52502E Unspecified fracture of the lower end of left radius, subsequent encounter for open fracture type I or II with routine healing: Secondary | ICD-10-CM | POA: Insufficient documentation

## 2015-11-02 DIAGNOSIS — R52 Pain, unspecified: Secondary | ICD-10-CM | POA: Diagnosis not present

## 2015-11-02 DIAGNOSIS — M25632 Stiffness of left wrist, not elsewhere classified: Secondary | ICD-10-CM | POA: Insufficient documentation

## 2015-11-02 DIAGNOSIS — S52572E Other intraarticular fracture of lower end of left radius, subsequent encounter for open fracture type I or II with routine healing: Secondary | ICD-10-CM | POA: Diagnosis not present

## 2015-11-08 DIAGNOSIS — H2511 Age-related nuclear cataract, right eye: Secondary | ICD-10-CM | POA: Diagnosis not present

## 2015-11-15 MED FILL — LEVOTHYROXINE 200 MCG TAB: 200 | 90 days supply | Qty: 90 | Fill #1

## 2015-11-16 MED FILL — BESIVANCE 0.6% SUSP: 0.6 | 30 days supply | Qty: 5 | Fill #1

## 2015-11-30 DIAGNOSIS — S52572A Other intraarticular fracture of lower end of left radius, initial encounter for closed fracture: Secondary | ICD-10-CM | POA: Diagnosis not present

## 2015-12-06 MED FILL — SERTRALINE HCL 100 MG TAB: 100 | 90 days supply | Qty: 90 | Fill #0

## 2015-12-21 DIAGNOSIS — S52572A Other intraarticular fracture of lower end of left radius, initial encounter for closed fracture: Secondary | ICD-10-CM | POA: Diagnosis not present

## 2016-01-18 DIAGNOSIS — S52572A Other intraarticular fracture of lower end of left radius, initial encounter for closed fracture: Secondary | ICD-10-CM | POA: Diagnosis not present

## 2016-02-21 MED FILL — LEVOTHYROXINE 200 MCG TAB: 200 | 90 days supply | Qty: 90 | Fill #2

## 2016-03-14 DIAGNOSIS — M65312 Trigger thumb, left thumb: Secondary | ICD-10-CM | POA: Diagnosis not present

## 2016-03-17 DIAGNOSIS — M859 Disorder of bone density and structure, unspecified: Secondary | ICD-10-CM | POA: Diagnosis not present

## 2016-03-17 DIAGNOSIS — R7301 Impaired fasting glucose: Secondary | ICD-10-CM | POA: Diagnosis not present

## 2016-03-17 DIAGNOSIS — E038 Other specified hypothyroidism: Secondary | ICD-10-CM | POA: Diagnosis not present

## 2016-03-17 DIAGNOSIS — Z0000 Encounter for general adult medical examination without abnormal findings: Secondary | ICD-10-CM | POA: Diagnosis not present

## 2016-03-20 MED FILL — SERTRALINE HCL 100 MG TAB: 100 | 90 days supply | Qty: 90 | Fill #1

## 2016-03-24 DIAGNOSIS — E038 Other specified hypothyroidism: Secondary | ICD-10-CM | POA: Diagnosis not present

## 2016-03-24 DIAGNOSIS — M1990 Unspecified osteoarthritis, unspecified site: Secondary | ICD-10-CM | POA: Diagnosis not present

## 2016-03-24 DIAGNOSIS — F900 Attention-deficit hyperactivity disorder, predominantly inattentive type: Secondary | ICD-10-CM | POA: Diagnosis not present

## 2016-03-24 DIAGNOSIS — Z1389 Encounter for screening for other disorder: Secondary | ICD-10-CM | POA: Diagnosis not present

## 2016-03-24 DIAGNOSIS — Z0000 Encounter for general adult medical examination without abnormal findings: Secondary | ICD-10-CM | POA: Diagnosis not present

## 2016-03-24 DIAGNOSIS — N39498 Other specified urinary incontinence: Secondary | ICD-10-CM | POA: Diagnosis not present

## 2016-03-24 DIAGNOSIS — R400 Somnolence: Secondary | ICD-10-CM | POA: Diagnosis not present

## 2016-03-24 DIAGNOSIS — F329 Major depressive disorder, single episode, unspecified: Secondary | ICD-10-CM | POA: Diagnosis not present

## 2016-03-24 DIAGNOSIS — R7301 Impaired fasting glucose: Secondary | ICD-10-CM | POA: Diagnosis not present

## 2016-03-24 DIAGNOSIS — E784 Other hyperlipidemia: Secondary | ICD-10-CM | POA: Diagnosis not present

## 2016-03-24 MED FILL — NYSTATIN 100,000 UNIT/GM PO: 100000 | 10 days supply | Qty: 60 | Fill #0

## 2016-03-27 ENCOUNTER — Ambulatory Visit (INDEPENDENT_AMBULATORY_CARE_PROVIDER_SITE_OTHER): Payer: 59 | Admitting: Neurology

## 2016-03-27 ENCOUNTER — Encounter: Payer: Self-pay | Admitting: Neurology

## 2016-03-27 VITALS — BP 136/76 | HR 100 | Resp 20 | Ht 65.0 in | Wt 264.0 lb

## 2016-03-27 DIAGNOSIS — Z124 Encounter for screening for malignant neoplasm of cervix: Secondary | ICD-10-CM | POA: Diagnosis not present

## 2016-03-27 DIAGNOSIS — R400 Somnolence: Secondary | ICD-10-CM

## 2016-03-27 DIAGNOSIS — Z130 Encounter for screening for diseases of the blood and blood-forming organs and certain disorders involving the immune mechanism: Secondary | ICD-10-CM | POA: Diagnosis not present

## 2016-03-27 DIAGNOSIS — R351 Nocturia: Secondary | ICD-10-CM

## 2016-03-27 DIAGNOSIS — Z1231 Encounter for screening mammogram for malignant neoplasm of breast: Secondary | ICD-10-CM | POA: Diagnosis not present

## 2016-03-27 DIAGNOSIS — Z1389 Encounter for screening for other disorder: Secondary | ICD-10-CM | POA: Diagnosis not present

## 2016-03-27 DIAGNOSIS — R0683 Snoring: Secondary | ICD-10-CM

## 2016-03-27 DIAGNOSIS — Z01419 Encounter for gynecological examination (general) (routine) without abnormal findings: Secondary | ICD-10-CM | POA: Diagnosis not present

## 2016-03-27 NOTE — Patient Instructions (Signed)

## 2016-03-27 NOTE — Progress Notes (Signed)
Subjective:    Patient ID: Belinda [name] is a 65 y.o. female.  HPI     Star Age, MD, PhD [facility] [address] [place], [place] [address]  Dear Dr. Joylene Draft,   I saw your patient, Belinda [name], upon your kind request, in my neurologic clinic today for initial consultation of her sleep disorder, in particular, concern for underlying obstructive sleep apnea. The patient is unaccompanied today. As you know, [name] is a 65 year old right-handed woman with an underlying medical history of hypothyroidism, depression, history of ADD, osteoarthritis, status post knee replacement surgeries bilaterally in 2006, allergic rhinitis, recent fall in July 2017 with left wrist fracture, status post surgery, and morbid obesity, who reports significant daytime somnolence for the past few years. She has been told that she snores. She was divorced for over 13 years and her ex-husband recently died in 01-Nov-2015 suddenly from a heart attack, most likely in his sleep. She has 2 grown daughters, the youngest one lives with her. The patient was working full-time [employer], in customer service, but was laid off recently as she fell asleep at work twice in the past month. She also has been working part-time at [employer] and part-time for her church and childcare on Wednesdays and Sundays evenings. She goes to bed around 1 AM typically, wakeup time is around 7:15 AM. Her Epworth sleepiness score is 19 out of 24, her fatigue score is 53 out of 63. She recently had an increase in her antidepressant medication to 150 mg of sertraline daily. She is a nonsmoker and drinks alcohol very infrequently, maybe once or twice per year but does drink caffeine on a daily basis in the form of coffee, 3 cups per day, one Diet Coke, and 2 glasses of sweet tea which are typically 3 days a week. She denies morning headaches and has nocturia about once or twice per night, has had nighttime leg  cramps which are painful. She had cataract surgeries in 01-Nov-2022 and July 2017. She does report stressors this year. She denies any significant restless leg symptoms and is not sure if she twitches her legs and her sleep. She denies any other significant sleep related symptoms. She has not fallen asleep at the wheel. I reviewed your office note from 03/24/2016, which you kindly included.  Her Past Medical History Is Significant For: Past Medical History:  Diagnosis Date  . Anxiety   . Asthma   . Candidiasis   . Cataracts, bilateral   . Depression   . Distal radius fracture, left   . Dyslipidemia   . 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 Hematuria   . Hypothyroidism   . IBS (irritable bowel syndrome)   . Osteopenia   . Seasonal allergies   . Stress incontinence     Her Past Surgical History Is Significant For: Past Surgical History:  Procedure Laterality Date  . CARPAL TUNNEL RELEASE Left 10/27/2015   Procedure: LEFT CARPAL TUNNEL RELEASE;  Surgeon: Charlotte Crumb, MD;  Location: [facility];  Service: Orthopedics;  Laterality: Left;  . CATARACT EXTRACTION    . JOINT REPLACEMENT Bilateral   . OPEN REDUCTION INTERNAL FIXATION (ORIF) DISTAL RADIAL FRACTURE Left 10/27/2015   Procedure: OPEN REDUCTION INTERNAL FIXATION (ORIF) LEFT  DISTAL RADIAL FRACTURE;  Surgeon: Charlotte Crumb, MD;  Location: [facility];  Service: Orthopedics;  Laterality: Left;  . TONSILLECTOMY    . TOTAL KNEE  ARTHROPLASTY     x 2    Her Family History Is Significant For: Family History  Problem Relation Age of Onset  . Kidney cancer Father   . Colon cancer Maternal Grandmother   . Lung cancer Maternal Grandfather   . Heart attack Maternal Grandfather     Her Social History Is Significant For: Social History   Social History  . Marital status: Divorced    Spouse name: N/A  . Number of children: N/A  . Years of education: N/A   Social History Main Topics  .  Smoking status: Never Smoker  . Smokeless tobacco: Never Used  . Alcohol use Yes     Comment: rarely   . Drug use: No  . Sexual activity: Not Asked   Other Topics Concern  . None   Social History Narrative  . None    Her Allergies Are:  Allergies  Allergen Reactions  . Penicillins Rash  :   Her Current Medications Are:  Outpatient Encounter Prescriptions as of 03/27/2016  Medication Sig  . cetirizine (ZYRTEC) 10 MG tablet Take 10 mg by mouth daily.  . Cholecalciferol (VITAMIN D3) 5000 units CAPS Take 10,000 Units by mouth once a week.   Marland Kitchen HYDROcodone-acetaminophen (NORCO/VICODIN) 5-325 MG tablet Take 1 tablet by mouth every 6 (six) hours as needed for moderate pain.  Marland Kitchen levothyroxine (SYNTHROID, LEVOTHROID) 200 MCG tablet Take 400 mcg by mouth once a week.   . nystatin ointment (MYCOSTATIN) Apply 1 application topically 2 (two) times daily.  . sertraline (ZOLOFT) 100 MG tablet Take 150 mg by mouth daily.   . [DISCONTINUED] oxyCODONE-acetaminophen (ROXICET) 5-325 MG tablet Take 1 tablet by mouth every 4 (four) hours as needed for severe pain.   No facility-administered encounter medications on file as of 03/27/2016.   :  Review of Systems:  Out of a complete 14 point review of systems, all are reviewed and negative with the exception of these symptoms as listed below:  Review of Systems  Neurological:       Pt presents today to discuss her sleepiness. Pt was told that she fell asleep twice while at work and therefore she was fired on 03/22/2016 from [employer]. Pt has never had a sleep study. Pt believes that she does snore at night.  Epworth Sleepiness Scale 0= would never doze 1= slight chance of dozing 2= moderate chance of dozing 3= high chance of dozing  Sitting and reading: 3 Watching TV: 3 Sitting inactive in a public place (ex. Theater or meeting): 3 As a passenger in a car for an hour without a break: 2 Lying down to rest in the afternoon: 3 Sitting and  talking to someone: 0 Sitting quietly after lunch (no alcohol): 2 In a car, while stopped in traffic: 3 Total: 19     Objective:  Neurologic Exam  Physical Exam Physical Examination:   Vitals:   03/27/16 1625  BP: 136/76  Pulse: 100  Resp: 20   General Examination: The patient is a very pleasant 65 y.o. female in no acute distress. She appears well-developed and well-nourished and well groomed.   HEENT: Normocephalic, atraumatic, pupils are equal, round and reactive to light and accommodation. Funduscopic exam is normal with sharp disc margins noted. She is status post bilateral cataract repairs. Extraocular tracking is good without limitation to gaze excursion or nystagmus noted. Normal smooth pursuit is noted. Hearing is grossly intact. Face is symmetric with normal facial animation and normal facial sensation. Speech is  clear with no dysarthria noted. There is no hypophonia. There is no lip, neck/head, jaw or voice tremor. Neck is supple with full range of passive and active motion. There are no carotid bruits on auscultation. Oropharynx exam reveals: mild mouth dryness, adequate dental hygiene and moderate airway crowding, due to smaller airway opening and redundant soft palate, could not visualize tip of uvula. Tonsils are absent. Mallampati is class III. Tongue protrudes centrally and palate elevates symmetrically. Neck size is 14 3/8 inches. She has a Mild overbite.   Chest: Clear to auscultation without wheezing, rhonchi or crackles noted.  Heart: S1+S2+0, regular and normal without murmurs, rubs or gallops noted.   Abdomen: Soft, non-tender and non-distended with normal bowel sounds appreciated on auscultation.  Extremities: There is no pitting edema in the distal lower extremities bilaterally. Pedal pulses are intact.  Skin: Warm and dry without trophic changes noted.   Musculoskeletal: exam reveals no obvious joint deformities, tenderness or joint swelling or erythema, but  she reports hip pain and low back pain.   Neurologically:  Mental status: The patient is awake, alert and oriented in all 4 spheres. Her immediate and remote memory, attention, language skills and fund of knowledge are appropriate. There is no evidence of aphasia, agnosia, apraxia or anomia. Speech is clear with normal prosody and enunciation. Thought process is linear. Mood is normal and affect is normal.  Cranial nerves II - XII are as described above under HEENT exam. In addition: shoulder shrug is normal with equal shoulder height noted. Motor exam: Normal bulk, strength and tone is noted. There is no drift, tremor or rebound. Romberg is negative. Reflexes are 2+ throughout. Babinski: Toes are flexor bilaterally. Fine motor skills and coordination: intact with normal finger taps, normal hand movements, normal rapid alternating patting, normal foot taps and normal foot agility.  Cerebellar testing: No dysmetria or intention tremor on finger to nose testing. Heel to shin is unremarkable bilaterally. There is no truncal or gait ataxia.  Sensory exam: intact to light touch, pinprick, vibration, temperature sense in the upper and lower extremities.  Gait, station and balance: She stands easily. No veering to one side is noted. No leaning to one side is noted. Posture is age-appropriate and stance is narrow based. Gait shows normal stride length and normal pace. No problems turning are noted. Tandem walk is not possible for her.   Assessment and Plan:   In summary, WINOGENE FICKLIN is a very pleasant 65 y.o.-year old female with an underlying medical history of hypothyroidism, depression, history of ADD, osteoarthritis, status post knee replacement surgeries bilaterally in 2006, allergic rhinitis, recent fall in July 2017 with left wrist fracture, status post surgery, and morbid obesity, whose history and physical exam are concerning for obstructive sleep apnea (OSA). She reports snoring, nocturia and EDS  and in light of her morbid obesity and her crowded Airway, the concern for underlying obstructive sleep apnea is certainly there. However, in addition, she probably has suffered from chronic sleep deprivation allowing herself barely 6 hours of sleep on an average night, as she has been working full-time and also had 2 part-time jobs. She is strongly advised to allow for enough sleep time on a night to night basis. This would be 7-8 hours for the average patient. In addition, she is advised to increase her water intake and decrease her caffeine intake. I had a long chat with the patient about my findings and the diagnosis of OSA, its prognosis and treatment options. We  talked about medical treatments, surgical interventions and non-pharmacological approaches. I explained in particular the risks and ramifications of untreated moderate to severe OSA, especially with respect to developing cardiovascular disease down the Road, including congestive heart failure, difficult to treat hypertension, cardiac arrhythmias, or stroke. Even type 2 diabetes has, in part, been linked to untreated OSA. Symptoms of untreated OSA include daytime sleepiness, memory problems, mood irritability and mood disorder such as depression and anxiety, lack of energy, as well as recurrent headaches, especially morning headaches. We talked about trying to maintain a healthy lifestyle in general, as well as the importance of weight control. I encouraged the patient to eat healthy, exercise daily and keep well hydrated, to keep a scheduled bedtime and wake time routine, to not skip any meals and eat healthy snacks in between meals. I advised the patient strongly not to drive when feeling sleepy.  I recommended the following at this time: sleep study with potential positive airway pressure titration. (We will score hypopneas at 3% and split the sleep study into diagnostic and treatment portion, if the estimated. 2 hour AHI is >15/h).   I  explained the sleep test procedure to the patient and also outlined possible surgical and non-surgical treatment options of OSA, including the use of a custom-made dental device (which would require a referral to a specialist dentist or oral surgeon), upper airway surgical options, such as pillar implants, radiofrequency surgery, tongue base surgery, and UPPP (which would involve a referral to an ENT surgeon). Rarely, jaw surgery such as mandibular advancement may be considered.  I also explained the CPAP treatment option to the patient, who indicated that she would be willing to try CPAP if the need arises. I explained the importance of being compliant with PAP treatment, not only for insurance purposes but primarily to improve Her symptoms, and for the patient's long term health benefit, including to reduce Her cardiovascular risks. I answered all her questions today and the patient was in agreement. I would like to see her back after the sleep study is completed and encouraged her to call with any interim questions, concerns, problems or updates.   Thank you very much for allowing me to participate in the care of this nice patient. If I can be of any further assistance to you please do not hesitate to call me at [phone].  Sincerely,   Star Age, MD, PhD

## 2016-04-06 ENCOUNTER — Ambulatory Visit (INDEPENDENT_AMBULATORY_CARE_PROVIDER_SITE_OTHER): Payer: 59 | Admitting: Neurology

## 2016-04-06 DIAGNOSIS — G4720 Circadian rhythm sleep disorder, unspecified type: Secondary | ICD-10-CM

## 2016-04-06 DIAGNOSIS — G4733 Obstructive sleep apnea (adult) (pediatric): Secondary | ICD-10-CM

## 2016-04-06 DIAGNOSIS — G4761 Periodic limb movement disorder: Secondary | ICD-10-CM

## 2016-04-06 DIAGNOSIS — R9431 Abnormal electrocardiogram [ECG] [EKG]: Secondary | ICD-10-CM

## 2016-04-07 NOTE — Progress Notes (Signed)
Patient referred by Dr. Joylene Draft, seen by me on 03/27/16, diagnostic PSG on 04/06/16.    Please call and notify the patient that the recent sleep study did confirm the diagnosis of mild obstructive sleep apnea with lower oxygen saturations throughout the night and severe PLMs, occasional PVCs. Given her severe sleepiness and lower asleep O2 values, I recommend treatment of her OSA with CPAP. This will require a repeat sleep study for proper titration, O2 monitoring and mask fitting. Please explain to patient and arrange for a CPAP titration study. I have placed an order in the chart. Thanks, and please route to [facility] for scheduling next sleep study.  Star Age, MD, PhD [facility])

## 2016-04-07 NOTE — Procedures (Signed)
PATIENT'S NAME:  Belinda [name], Belinda [name] DOB:      [birth year]      MR#:    [id number]     DATE OF RECORDING: 04/06/2016 REFERRING M.D.:  Crist Infante MD Study Performed:   Baseline Polysomnogram HISTORY:  65 year old woman with a history of hypothyroidism, depression, history of ADD, osteoarthritis, status post knee replacement surgeries bilaterally in 2006, allergic rhinitis, recent fall in July 2017 with left wrist fracture, status post surgery, and morbid obesity, who reports significant daytime somnolence for the past few years. She has been told that she snores. The patient endorsed the Epworth Sleepiness Scale at 19 points.  The patient's weight 265 pounds with a height of 65 (inches), resulting in a BMI of 44.1 kg/m2. The patient's neck circumference measured 14.5 inches.  CURRENT MEDICATIONS: Zyrtec, Norco, Synthroid, Mycostatin, Zoloft   PROCEDURE:  This is a multichannel digital polysomnogram utilizing the Somnostar 11.2 system.  Electrodes and sensors were applied and monitored per AASM Specifications.   EEG, EOG, Chin and Limb EMG, were sampled at 200 Hz.  ECG, Snore and Nasal Pressure, Thermal Airflow, Respiratory Effort, CPAP Flow and Pressure, Oximetry was sampled at 50 Hz. Digital video and audio were recorded.      BASELINE STUDY  Lights Out was at 21:08 and Lights On at 04:59.  Total recording time (TRT) was 471.5 minutes, with a total sleep time (TST) of  342.5 minutes.   The patient's sleep latency was 22.5 minutes.  REM latency was 151 minutes, which is mildly prolonged.  The sleep efficiency was 72.6 %, which is reduced.     SLEEP ARCHITECTURE: WASO (Wake after sleep onset) was 62.5 minutes with mild to moderate sleep fragmentation noted.  There were 33.5 minutes in Stage N1, 114 minutes Stage N2, 128.5 minutes Stage N3 and 66.5 minutes in Stage REM.  The percentage of Stage N1 was 9.8%, which is increased, Stage N2 was 33.3%, which is reduced, Stage N3 was 37.5%, which is increased,  and Stage R (REM sleep) was 19.4%, which is normal.   The arousals were noted as: 34 were spontaneous, 23 were associated with PLMs, 10 were associated with respiratory events.   Audio and video analysis did not show any abnormal or unusual movements, behaviors, phonations or vocalizations.  The patient took no bathroom breaks.  Mild to moderate snoring was noted. The EKG was in keeping with normal sinus rhythm (NSR) with occasional PVCs noted.  RESPIRATORY ANALYSIS:  There were a total of 39 respiratory events:  2 obstructive apneas, 0 central apneas and 0 mixed apneas with a total of 2 apneas and an apnea index (AI) of .4 /hour. There were 37 hypopneas with a hypopnea index of 6.5 /hour. The patient also had 0 respiratory event related arousals (RERAs).      The total APNEA/HYPOPNEA INDEX (AHI) was 6.8/hour and the total RESPIRATORY DISTURBANCE INDEX was 6.8 /hour.  11 events occurred in REM sleep and 52 events in NREM. The REM AHI was 9.9 /hour, versus a non-REM AHI of 6.1. The patient spent 180 minutes of total sleep time in the supine position and 163 minutes in non-supine.. The supine AHI was 10.0 versus a non-supine AHI of 3.3.  OXYGEN SATURATION & C02:  The Wake baseline 02 saturation was 90%, with the lowest being 76%. Time spent below 89% saturation equaled 246 minutes.  PERIODIC LIMB MOVEMENTS:   The patient had a total of 598 Periodic Limb Movements.  The Periodic Limb Movement (PLM) index was  104.8 and the PLM Arousal index was 4./hour. Post-study, the patient indicated that sleep was the same as usual.   IMPRESSION: 1. Obstructive Sleep Apnea (OSA) 2. Periodic Limb Movement Disorder (PLMD) 3. Non-specific abnormal EKG 4. Dysfunctions associated with sleep stages or arousal from sleep  RECOMMENDATIONS:  1. This overnight polysomnogram demonstrates overall mild obstructive sleep apnea by number of events, but significant desaturations during REM sleep. Given her sleep related  complaints and lower oxygen saturations during sleep, positive airway pressure in the form of CPAP is recommended. This is achieved during a full night CPAP titration study for proper treatment settings and mask fitting, and monitoring of her O2 levels. In addition, avoidance of the supine sleep position and weight loss should be pursued. Alternative treatments for OSA may include the use of an oral appliance (aka dental device, custom made by a specialized dentist usually), or upper airway or jaw surgery (not usually first line treatments). 2. Please note that untreated obstructive sleep apnea carries additional perioperative morbidity. Patients with significant obstructive sleep apnea should receive perioperative PAP therapy and the surgeons and particularly the anesthesiologist should be informed of the diagnosis and the severity of the sleep disordered breathing. 3. The patient should be cautioned not to drive, work at heights, or operate dangerous or heavy equipment when tired or sleepy. Review and reiteration of good sleep hygiene measures should be pursued with any patient. 4. Severe PLMs (periodic limb movements of sleep) were noted during the study with very mild arousals; clinical correlation is recommended. Medication effect from the patient's antidepressant medication should be considered.  5. This study shows sleep fragmentation and abnormal sleep stage percentages; these are nonspecific findings and per se do not signify an intrinsic sleep disorder or a cause for the patient's sleep-related symptoms. Causes include (but are not limited to) the first night effect of the sleep study, circadian rhythm disturbances, medication effect or an underlying mood disorder or medical problem.  6. The study showed occasional PVCs on single lead EKG; clinical correlation is recommended and consultation with cardiology may be feasible.  7. Narcotic pain medications should be avoided. Weight loss should be  pursued aggressively.  8. The patient will be seen in follow-up by Dr. Rexene Alberts at [facility] for discussion of the test results and further management strategies. The referring provider will be notified of the test results.  I certify that I have reviewed the entire raw data recording prior to the issuance of this report in accordance with the Standards of Accreditation of the American Academy of Sleep Medicine (AASM)    Star Age, MD, PhD Diplomat, American Board of Psychiatry and Neurology (Neurology and Sleep Medicine)

## 2016-04-07 NOTE — Addendum Note (Signed)
Addended by: Star Age on: 04/07/2016 01:57 PM   Modules accepted: Orders

## 2016-04-12 ENCOUNTER — Telehealth: Payer: Self-pay

## 2016-04-12 NOTE — Telephone Encounter (Signed)
I spoke to pt. I advised her that her her sleep study revealed that she had has sleep apnea with lower oxygen saturations and severe PLMs with occasional PVCs. Given her symptoms, Dr. Rexene Alberts recommends that she have a cpap titration study. Pt is agreeable to this. Pt is asking if she can have the sleep study done before the end of the year? I advised pt that I would check with our sleep study scheduler but it may be full for this year. Pt verbalized understanding of results. Pt had no questions at this time but was encouraged to call back if questions arise.

## 2016-04-12 NOTE — Telephone Encounter (Signed)
I called pt to discuss sleep study results. No answer, left a message on her home and cell phone number asking her to call me back.

## 2016-04-12 NOTE — Telephone Encounter (Signed)
-----   Message from Star Age, MD sent at 04/07/2016  1:57 PM EST ----- Patient referred by Dr. Joylene Draft, seen by me on 03/27/16, diagnostic PSG on 04/06/16.    Please call and notify the patient that the recent sleep study did confirm the diagnosis of mild obstructive sleep apnea with lower oxygen saturations throughout the night and severe PLMs, occasional PVCs. Given her severe sleepiness and lower asleep O2 values, I recommend treatment of her OSA with CPAP. This will require a repeat sleep study for proper titration, O2 monitoring and mask fitting. Please explain to patient and arrange for a CPAP titration study. I have placed an order in the chart. Thanks, and please route to [facility] for scheduling next sleep study.  Star Age, MD, PhD [facility])

## 2016-04-13 DIAGNOSIS — M65312 Trigger thumb, left thumb: Secondary | ICD-10-CM | POA: Diagnosis not present

## 2016-05-11 ENCOUNTER — Telehealth: Payer: Self-pay | Admitting: Neurology

## 2016-05-11 DIAGNOSIS — G4733 Obstructive sleep apnea (adult) (pediatric): Secondary | ICD-10-CM

## 2016-05-11 NOTE — Telephone Encounter (Signed)
We will set patient up with autoPAP at home, as insurance denied in house titration study for OSA. Pls process order and notify patient and set up FU in 8-10 weeks.

## 2016-05-11 NOTE — Telephone Encounter (Signed)
[employer] denied CPAP patient qualifies for APAP

## 2016-05-17 NOTE — Telephone Encounter (Signed)
LM on home and cell phone for patient to call back and discuss next step in treatment for sleep apnea.

## 2016-05-22 MED FILL — LEVOTHYROXINE 200 MCG TAB: 200 | 90 days supply | Qty: 90 | Fill #3

## 2016-05-22 NOTE — Telephone Encounter (Signed)
I spoke to patient and she is aware that insurance recommends Auto-PAP. She is willing to proceed with treatment. I will send orders to [facility]. Patient will get a letter reminding her to make f/u appt and stress the importance of compliance. PCP has received a copy of the report.

## 2016-06-13 ENCOUNTER — Telehealth: Payer: Self-pay | Admitting: Neurology

## 2016-06-13 NOTE — Telephone Encounter (Signed)
Okay to write letter 

## 2016-06-13 NOTE — Telephone Encounter (Signed)
I called pt to discuss. No answer, left a message asking her to call me back. 

## 2016-06-13 NOTE — Telephone Encounter (Signed)
Pt called to advise she has called [facility] but they are not returning her call. She also needs a letter for unemployment appeal that states she has sleep apnea. Please call to clarify with the patient.

## 2016-06-13 NOTE — Telephone Encounter (Signed)
I called pt again to discuss. No answer, left a message asking her to call me back. 

## 2016-06-13 NOTE — Telephone Encounter (Signed)
I called [facility], spoke to [place]. They have not had any messages left from this pt. However, Lattie Haw will call her right now.

## 2016-06-13 NOTE — Telephone Encounter (Signed)
Pt returned my call. Pt says that [facility] has not contacted her. I gave her [facility]'s number [phone]). Pt will call them. Pt says that she needs a letter from Dr. Rexene Alberts that states that she has mild sleep apnea and that Dr. Rexene Alberts is recommending that she be treated for her mild sleep apnea with an auto pap. She is filing an appeal because she was fired from her job for falling asleep. She also wants her auto pap start letter too;she lost this as well.  Ok to write this letter?  "Ms. Shriley Cane is under my care at [facility]. Ms. Smigielski underwent a sleep study that revealed mild obstructive sleep apnea and I have recommended that she have treatment for this in the form of an auto pap. I will be monitoring her auto pap compliance and the effect it has on her degree of sleepiness."

## 2016-06-14 NOTE — Telephone Encounter (Signed)
I called pt, she wishes to pick these letters up, will place letters for her at the front desk. Pt verbalized understanding.

## 2016-06-14 NOTE — Telephone Encounter (Signed)
I called pt to advise her that both of her letters are ready.  No answer, left a message asking her to call me back.  I need to know if she would like these letters mailed to her or if she would like them to be placed at the front desk for pick up.

## 2016-06-20 MED FILL — NYAMYC 100,000 UNITS/GM PWD: 100000 | 10 days supply | Qty: 60 | Fill #1

## 2016-07-04 MED FILL — SERTRALINE HCL 100 MG TAB: 100 | 90 days supply | Qty: 90 | Fill #2

## 2016-07-31 MED FILL — NYAMYC 100,000 UNITS/GM PWD: 100000 | 10 days supply | Qty: 60 | Fill #2

## 2016-08-08 MED FILL — LEVOTHYROXINE 200 MCG TAB: 200 | 90 days supply | Qty: 102 | Fill #0

## 2016-10-02 MED FILL — ASMANEX TWIST 220 MCG-60: 220 | 30 days supply | Qty: 1 | Fill #0 | Status: TO

## 2016-10-17 MED FILL — NYSTOP 100,000 UNITS/GM PWD: 100000 | 30 days supply | Qty: 60 | Fill #0

## 2016-11-08 MED FILL — CLINDAMYCIN HCL 150 MG CAPS: 150 | 4 days supply | Qty: 16 | Fill #0 | Status: TO

## 2016-11-09 MED FILL — SERTRALINE HCL 100 MG TAB: 100 | 90 days supply | Qty: 90 | Fill #3

## 2016-11-15 MED FILL — LEVOTHYROXINE 200 MCG TAB: 200 | 90 days supply | Qty: 102 | Fill #1

## 2016-12-08 MED FILL — NYSTOP 100,000 UNITS/GM PWD: 100000 | 30 days supply | Qty: 60 | Fill #1

## 2017-03-01 MED FILL — LEVOTHYROXINE 200 MCG TAB: 200 | 15 days supply | Qty: 18 | Fill #2 | Status: TO

## 2017-03-02 DIAGNOSIS — Z0000 Encounter for general adult medical examination without abnormal findings: Secondary | ICD-10-CM | POA: Diagnosis not present

## 2017-03-06 DIAGNOSIS — H2513 Age-related nuclear cataract, bilateral: Secondary | ICD-10-CM | POA: Diagnosis not present

## 2017-03-07 ENCOUNTER — Telehealth: Payer: Self-pay

## 2017-03-07 NOTE — Telephone Encounter (Signed)
[facility] reached out to me. They need follow up notes for pt's cpap compliance and usage since pt is now with Medicare in order for pt to get new supplies. Pt is agreeable to seeing Jinny Blossom, NP on 03/12/17 at 7:30am. Pt verbalized understanding of new appt date and time.

## 2017-03-12 ENCOUNTER — Telehealth: Payer: Self-pay | Admitting: *Deleted

## 2017-03-12 ENCOUNTER — Ambulatory Visit: Payer: Self-pay | Admitting: Adult Health

## 2017-03-12 NOTE — Telephone Encounter (Signed)
Patient was no show for follow up with NP today.  

## 2017-03-12 NOTE — Progress Notes (Deleted)
PATIENT: Belinda [name] DOB: 1951/02/18  REASON FOR VISIT: follow up HISTORY FROM: patient  HISTORY OF PRESENT ILLNESS: HISTORY Belinda [name] is a 66 year old right-handed woman with an underlying medical history of hypothyroidism, depression, history of ADD, osteoarthritis, status post knee replacement surgeries bilaterally in 2006, allergic rhinitis, recent fall in July 2017 with left wrist fracture, status post surgery, and morbid obesity, who reports significant daytime somnolence for the past few years. She has been told that she snores. She was divorced for over 13 years and her ex-husband recently died in 11-08-15 suddenly from a heart attack, most likely in his sleep. She has 2 grown daughters, the youngest one lives with her. The patient was working full-time [employer], in customer service, but was laid off recently as she fell asleep at work twice in the past month. She also has been working part-time at [employer] and part-time for her church and childcare on Wednesdays and Sundays evenings. She goes to bed around 1 AM typically, wakeup time is around 7:15 AM. Her Epworth sleepiness score is 19 out of 24, her fatigue score is 53 out of 63. She recently had an increase in her antidepressant medication to 150 mg of sertraline daily. She is a nonsmoker and drinks alcohol very infrequently, maybe once or twice per year but does drink caffeine on a daily basis in the form of coffee, 3 cups per day, one Diet Coke, and 2 glasses of sweet tea which are typically 3 days a week. She denies morning headaches and has nocturia about once or twice per night, has had nighttime leg cramps which are painful. She had cataract surgeries in 11-08-2022 and July 2017. She does report stressors this year. She denies any significant restless leg symptoms and is not sure if she twitches her legs and her sleep. She denies any other significant sleep related symptoms. She has not fallen asleep at the wheel.  REVIEW OF  SYSTEMS: Out of a complete 14 system review of symptoms, the patient complains only of the following symptoms, and all other reviewed systems are negative.  ALLERGIES: Allergies  Allergen Reactions  . Penicillins Rash    HOME MEDICATIONS: Outpatient Medications Prior to Visit  Medication Sig Dispense Refill  . cetirizine (ZYRTEC) 10 MG tablet Take 10 mg by mouth daily.    . Cholecalciferol (VITAMIN D3) 5000 units CAPS Take 10,000 Units by mouth once a week.     Marland Kitchen HYDROcodone-acetaminophen (NORCO/VICODIN) 5-325 MG tablet Take 1 tablet by mouth every 6 (six) hours as needed for moderate pain. 15 tablet 0  . levothyroxine (SYNTHROID, LEVOTHROID) 200 MCG tablet Take 400 mcg by mouth once a week.     . nystatin ointment (MYCOSTATIN) Apply 1 application topically 2 (two) times daily.    . sertraline (ZOLOFT) 100 MG tablet Take 150 mg by mouth daily.      No facility-administered medications prior to visit.     PAST MEDICAL HISTORY: Past Medical History:  Diagnosis Date  . Anxiety   . Asthma   . Candidiasis   . Cataracts, bilateral   . Depression   . Distal radius fracture, left   . Dyslipidemia   . 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 Hematuria   . Hypothyroidism   . IBS (irritable bowel syndrome)   . Osteopenia   . Seasonal allergies   . Stress incontinence     PAST SURGICAL HISTORY: Past Surgical History:  Procedure Laterality Date  .  CARPAL TUNNEL RELEASE Left 10/27/2015   Procedure: LEFT CARPAL TUNNEL RELEASE;  Surgeon: Charlotte Crumb, MD;  Location: [place];  Service: Orthopedics;  Laterality: Left;  . CATARACT EXTRACTION    . JOINT REPLACEMENT Bilateral   . OPEN REDUCTION INTERNAL FIXATION (ORIF) DISTAL RADIAL FRACTURE Left 10/27/2015   Procedure: OPEN REDUCTION INTERNAL FIXATION (ORIF) LEFT  DISTAL RADIAL FRACTURE;  Surgeon: Charlotte Crumb, MD;  Location: [place];  Service: Orthopedics;  Laterality: Left;  .  TONSILLECTOMY    . TOTAL KNEE ARTHROPLASTY     x 2    FAMILY HISTORY: Family History  Problem Relation Age of Onset  . Kidney cancer Father   . Colon cancer Maternal Grandmother   . Lung cancer Maternal Grandfather   . Heart attack Maternal Grandfather     SOCIAL HISTORY: Social History   Socioeconomic History  . Marital status: Divorced    Spouse name: Not on file  . Number of children: Not on file  . Years of education: Not on file  . Highest education level: Not on file  Social Needs  . Financial resource strain: Not on file  . Food insecurity - worry: Not on file  . Food insecurity - inability: Not on file  . Transportation needs - medical: Not on file  . Transportation needs - non-medical: Not on file  Occupational History  . Not on file  Tobacco Use  . Smoking status: Never Smoker  . Smokeless tobacco: Never Used  Substance and Sexual Activity  . Alcohol use: Yes    Comment: rarely   . Drug use: No  . Sexual activity: Not on file  Other Topics Concern  . Not on file  Social History Narrative  . Not on file      PHYSICAL EXAM  There were no vitals filed for this visit. There is no height or weight on file to calculate BMI.  Generalized: Well developed, in no acute distress   Neurological examination  Mentation: Alert oriented to time, place, history taking. Follows all commands speech and language fluent Cranial nerve II-XII: Pupils were equal round reactive to light. Extraocular movements were full, visual field were full on confrontational test. Facial sensation and strength were normal. Uvula tongue midline. Head turning and shoulder shrug  were normal and symmetric. Motor: The motor testing reveals 5 over 5 strength of all 4 extremities. Good symmetric motor tone is noted throughout.  Sensory: Sensory testing is intact to soft touch on all 4 extremities. No evidence of extinction is noted.  Coordination: Cerebellar testing reveals good  finger-nose-finger and heel-to-shin bilaterally.  Gait and station: Gait is normal. Tandem gait is normal. Romberg is negative. No drift is seen.  Reflexes: Deep tendon reflexes are symmetric and normal bilaterally.   DIAGNOSTIC DATA (LABS, IMAGING, TESTING) - I reviewed patient records, labs, notes, testing and imaging myself where available.  No results found for: WBC, HGB, HCT, MCV, PLT No results found for: NA, K, CL, CO2, GLUCOSE, BUN, CREATININE, CALCIUM, PROT, ALBUMIN, AST, ALT, ALKPHOS, BILITOT, GFRNONAA, GFRAA No results found for: CHOL, HDL, LDLCALC, LDLDIRECT, TRIG, CHOLHDL No results found for: HGBA1C No results found for: VITAMINB12 No results found for: TSH    ASSESSMENT AND PLAN 66 y.o. year old female  has a past medical history of Anxiety, Asthma, Candidiasis, Cataracts, bilateral, Depression, Distal radius fracture, left, Dyslipidemia, Heart murmur, Hematuria, Hypothyroidism, IBS (irritable bowel syndrome), Osteopenia, Seasonal allergies, and Stress incontinence. here with ***  Ward Givens, MSN, NP-C 03/12/2017, 7:23 AM [facility] [address], [place], [place] [address] [phone]

## 2017-03-13 ENCOUNTER — Encounter: Payer: Self-pay | Admitting: Adult Health

## 2017-03-14 ENCOUNTER — Encounter (INDEPENDENT_AMBULATORY_CARE_PROVIDER_SITE_OTHER): Payer: Self-pay

## 2017-03-14 ENCOUNTER — Ambulatory Visit (INDEPENDENT_AMBULATORY_CARE_PROVIDER_SITE_OTHER): Payer: Medicare Other | Admitting: Adult Health

## 2017-03-14 ENCOUNTER — Encounter: Payer: Self-pay | Admitting: Adult Health

## 2017-03-14 VITALS — BP 132/67 | HR 82 | Wt 257.6 lb

## 2017-03-14 DIAGNOSIS — G4733 Obstructive sleep apnea (adult) (pediatric): Secondary | ICD-10-CM | POA: Diagnosis not present

## 2017-03-14 DIAGNOSIS — Z9989 Dependence on other enabling machines and devices: Secondary | ICD-10-CM | POA: Diagnosis not present

## 2017-03-14 NOTE — Progress Notes (Addendum)
PATIENT: Belinda [name] DOB: [birth year]  REASON FOR VISIT: follow up HISTORY FROM: patient  HISTORY OF PRESENT ILLNESS: Today 03/14/17: Belinda [name] is a 66 year old female with a history of obstructive sleep apnea on CPAP.  She returns today for follow-up.  Her download indicates that she uses her machine 27 out of 30 days for compliance of 90%.  She uses her machine greater than 4 hours 21 out of 30 days for compliance of 70%.  On average she uses her machine 5 hours and 32.  Her residual AHI is 1 on a minimum pressure of 4 cm of water and maximum pressure of 13 cm of water.  She has a leak in the 95th percentile at 20.9 L/min.  She reports that she has noticed the benefit with the CPAP.  She states that she does feel that her mass may be too big.  She plans to stop by her DME company this afternoon to have her mask adjusted.  She returns today for an evaluation.  HISTORY 03/27/16: Belinda [name] is a 65 year old right-handed woman with an underlying medical history of hypothyroidism, depression, history of ADD, osteoarthritis, status post knee replacement surgeries bilaterally in 2006, allergic rhinitis, recent fall in July 2017 with left wrist fracture, status post surgery, and morbid obesity, who reports significant daytime somnolence for the past few years. She has been told that she snores. She was divorced for over 13 years and her ex-husband recently died in 2015-11-04 suddenly from a heart attack, most likely in his sleep. She has 2 grown daughters, the youngest one lives with her. The patient was working full-time [employer], in customer service, but was laid off recently as she fell asleep at work twice in the past month. She also has been working part-time at [employer] and part-time for her church and childcare on Wednesdays and Sundays evenings. She goes to bed around 1 AM typically, wakeup time is around 7:15 AM. Her Epworth sleepiness score is 19 out of 24, her fatigue score is 53 out of  63. She recently had an increase in her antidepressant medication to 150 mg of sertraline daily. She is a nonsmoker and drinks alcohol very infrequently, maybe once or twice per year but does drink caffeine on a daily basis in the form of coffee, 3 cups per day, one Diet Coke, and 2 glasses of sweet tea which are typically 3 days a week. She denies morning headaches and has nocturia about once or twice per night, has had nighttime leg cramps which are painful. She had cataract surgeries in 11-04-2022 and July 2017. She does report stressors this year. She denies any significant restless leg symptoms and is not sure if she twitches her legs and her sleep. She denies any other significant sleep related symptoms. She has not fallen asleep at the wheel. I reviewed your office note from 03/24/2016, which you kindly included  REVIEW OF SYSTEMS: Out of a complete 14 system review of symptoms, the patient complains only of the following symptoms, and all other reviewed systems are negative.  See HPI, Epworth sleepiness score 10 fatigue severity score 43  ALLERGIES: Allergies  Allergen Reactions  . Penicillins Rash    HOME MEDICATIONS: Outpatient Medications Prior to Visit  Medication Sig Dispense Refill  . cetirizine (ZYRTEC) 10 MG tablet Take 10 mg by mouth daily.    . Cholecalciferol (VITAMIN D3) 5000 units CAPS Take 10,000 Units by mouth once a week.     . levothyroxine (  SYNTHROID, LEVOTHROID) 200 MCG tablet Take 400 mcg by mouth once a week.     . nystatin ointment (MYCOSTATIN) Apply 1 application topically 2 (two) times daily.    . sertraline (ZOLOFT) 100 MG tablet Take 150 mg by mouth daily.     Marland Kitchen HYDROcodone-acetaminophen (NORCO/VICODIN) 5-325 MG tablet Take 1 tablet by mouth every 6 (six) hours as needed for moderate pain. 15 tablet 0  . levothyroxine (SYNTHROID, LEVOTHROID) 200 MCG tablet Take 200 mcg by mouth daily.     No facility-administered medications prior to visit.     PAST MEDICAL  HISTORY: Past Medical History:  Diagnosis Date  . Anxiety   . Asthma   . Candidiasis   . Cataracts, bilateral   . CPAP (continuous positive airway pressure) dependence   . Depression   . Distal radius fracture, left   . Dyslipidemia   . 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 Hematuria   . Hypothyroidism   . IBS (irritable bowel syndrome)   . Osteopenia   . Seasonal allergies   . Stress incontinence     PAST SURGICAL HISTORY: Past Surgical History:  Procedure Laterality Date  . CARPAL TUNNEL RELEASE Left 10/27/2015   Procedure: LEFT CARPAL TUNNEL RELEASE;  Surgeon: Charlotte Crumb, MD;  Location: [place];  Service: Orthopedics;  Laterality: Left;  . CATARACT EXTRACTION    . CATARACT EXTRACTION  2017   x 2  . JOINT REPLACEMENT Bilateral   . OPEN REDUCTION INTERNAL FIXATION (ORIF) DISTAL RADIAL FRACTURE Left 10/27/2015   Procedure: OPEN REDUCTION INTERNAL FIXATION (ORIF) LEFT  DISTAL RADIAL FRACTURE;  Surgeon: Charlotte Crumb, MD;  Location: [place];  Service: Orthopedics;  Laterality: Left;  . TONSILLECTOMY    . TOTAL KNEE ARTHROPLASTY     x 2    FAMILY HISTORY: Family History  Problem Relation Age of Onset  . Kidney cancer Father   . Colon cancer Maternal Grandmother   . Lung cancer Maternal Grandfather   . Heart attack Maternal Grandfather     SOCIAL HISTORY: Social History   Socioeconomic History  . Marital status: Divorced    Spouse name: Not on file  . Number of children: Not on file  . Years of education: Not on file  . Highest education level: Not on file  Social Needs  . Financial resource strain: Not on file  . Food insecurity - worry: Not on file  . Food insecurity - inability: Not on file  . Transportation needs - medical: Not on file  . Transportation needs - non-medical: Not on file  Occupational History  . Not on file  Tobacco Use  . Smoking status: Never Smoker  . Smokeless tobacco: Never Used    Substance and Sexual Activity  . Alcohol use: Yes    Comment: rarely   . Drug use: No  . Sexual activity: Not on file  Other Topics Concern  . Not on file  Social History Narrative  . Not on file      PHYSICAL EXAM  Vitals:   03/14/17 1021  BP: 132/67  Pulse: 82  Weight: 257 lb 9.6 oz (116.8 kg)   Body mass index is 42.87 kg/m.  Generalized: Well developed, in no acute distress   Neurological examination  Mentation: Alert oriented to time, place, history taking. Follows all commands speech and language fluent Cranial nerve II-XII: Pupils were equal round reactive to light. Extraocular movements were full, visual field  were full on confrontational test. Facial sensation and strength were normal. Uvula tongue midline. Head turning and shoulder shrug  were normal and symmetric. Motor: The motor testing reveals 5 over 5 strength of all 4 extremities. Good symmetric motor tone is noted throughout.  Sensory: Sensory testing is intact to soft touch on all 4 extremities. No evidence of extinction is noted.  Coordination: Cerebellar testing reveals good finger-nose-finger and heel-to-shin bilaterally.  Gait and station: Gait is normal.  Reflexes: Deep tendon reflexes are symmetric and normal bilaterally.   DIAGNOSTIC DATA (LABS, IMAGING, TESTING) - I reviewed patient records, labs, notes, testing and imaging myself where available.    ASSESSMENT AND PLAN 66 y.o. year old female  has a past medical history of Anxiety, Asthma, Candidiasis, Cataracts, bilateral, CPAP (continuous positive airway pressure) dependence, Depression, Distal radius fracture, left, Dyslipidemia, Heart murmur, Hematuria, Hypothyroidism, IBS (irritable bowel syndrome), Osteopenia, Seasonal allergies, and Stress incontinence. here with:  1.  Obstructive sleep apnea on CPAP  The patient CPAP compliance shows excellent compliance and good treatment of her apnea.  She does have a leak but she is planning to stop  by [facility] this afternoon to have her mask adjusted.  She is advised that if her symptoms worsen or she develops new symptoms she should let us know.  She will follow-up in 6 months or sooner if needed.    Ward Givens, MSN, NP-C 03/14/2017, 10:31 AM [facility] [address], [place], [place] [address] [phone]  I reviewed the above note and documentation by the Nurse Practitioner and agree with the history, physical exam, assessment and plan as outlined above. I was immediately available for face-to-face consultation. Star Age, MD, PhD [facility])

## 2017-03-14 NOTE — Patient Instructions (Signed)
Your Plan:  Continue using CPAP nightly Stop at [facility] to have mask refitting If your symptoms worsen or you develop new symptoms please let us know.   Thank you for coming to see [place] at [facility]. I hope we have been able to provide you high quality care today.  You may receive a patient satisfaction survey over the next few weeks. We would appreciate your feedback and comments so that we may continue to improve ourselves and the health of our patients.

## 2017-04-02 DIAGNOSIS — Z124 Encounter for screening for malignant neoplasm of cervix: Secondary | ICD-10-CM | POA: Diagnosis not present

## 2017-04-02 DIAGNOSIS — Z01419 Encounter for gynecological examination (general) (routine) without abnormal findings: Secondary | ICD-10-CM | POA: Diagnosis not present

## 2017-04-02 DIAGNOSIS — Z1389 Encounter for screening for other disorder: Secondary | ICD-10-CM | POA: Diagnosis not present

## 2017-04-02 DIAGNOSIS — Z1231 Encounter for screening mammogram for malignant neoplasm of breast: Secondary | ICD-10-CM | POA: Diagnosis not present

## 2017-04-02 DIAGNOSIS — N841 Polyp of cervix uteri: Secondary | ICD-10-CM | POA: Diagnosis not present

## 2017-04-02 DIAGNOSIS — Z130 Encounter for screening for diseases of the blood and blood-forming organs and certain disorders involving the immune mechanism: Secondary | ICD-10-CM | POA: Diagnosis not present

## 2017-04-02 DIAGNOSIS — B372 Candidiasis of skin and nail: Secondary | ICD-10-CM | POA: Diagnosis not present

## 2017-06-15 DIAGNOSIS — M7090 Unspecified soft tissue disorder related to use, overuse and pressure of unspecified site: Secondary | ICD-10-CM | POA: Diagnosis not present

## 2017-06-15 DIAGNOSIS — Z6841 Body Mass Index (BMI) 40.0 and over, adult: Secondary | ICD-10-CM | POA: Diagnosis not present

## 2017-07-16 DIAGNOSIS — E038 Other specified hypothyroidism: Secondary | ICD-10-CM | POA: Diagnosis not present

## 2017-07-16 DIAGNOSIS — R7301 Impaired fasting glucose: Secondary | ICD-10-CM | POA: Diagnosis not present

## 2017-07-16 DIAGNOSIS — E7849 Other hyperlipidemia: Secondary | ICD-10-CM | POA: Diagnosis not present

## 2017-07-16 DIAGNOSIS — R82998 Other abnormal findings in urine: Secondary | ICD-10-CM | POA: Diagnosis not present

## 2017-07-16 DIAGNOSIS — M859 Disorder of bone density and structure, unspecified: Secondary | ICD-10-CM | POA: Diagnosis not present

## 2017-07-23 DIAGNOSIS — F900 Attention-deficit hyperactivity disorder, predominantly inattentive type: Secondary | ICD-10-CM | POA: Diagnosis not present

## 2017-07-23 DIAGNOSIS — Z0000 Encounter for general adult medical examination without abnormal findings: Secondary | ICD-10-CM | POA: Diagnosis not present

## 2017-07-23 DIAGNOSIS — R400 Somnolence: Secondary | ICD-10-CM | POA: Diagnosis not present

## 2017-07-23 DIAGNOSIS — Z1389 Encounter for screening for other disorder: Secondary | ICD-10-CM | POA: Diagnosis not present

## 2017-07-23 DIAGNOSIS — F3289 Other specified depressive episodes: Secondary | ICD-10-CM | POA: Diagnosis not present

## 2017-07-23 DIAGNOSIS — H6121 Impacted cerumen, right ear: Secondary | ICD-10-CM | POA: Diagnosis not present

## 2017-07-23 DIAGNOSIS — E7849 Other hyperlipidemia: Secondary | ICD-10-CM | POA: Diagnosis not present

## 2017-07-23 DIAGNOSIS — Z6841 Body Mass Index (BMI) 40.0 and over, adult: Secondary | ICD-10-CM | POA: Diagnosis not present

## 2017-07-23 DIAGNOSIS — G4733 Obstructive sleep apnea (adult) (pediatric): Secondary | ICD-10-CM | POA: Diagnosis not present

## 2017-07-23 DIAGNOSIS — R3121 Asymptomatic microscopic hematuria: Secondary | ICD-10-CM | POA: Diagnosis not present

## 2017-07-23 DIAGNOSIS — M7090 Unspecified soft tissue disorder related to use, overuse and pressure of unspecified site: Secondary | ICD-10-CM | POA: Diagnosis not present

## 2017-07-23 DIAGNOSIS — J45909 Unspecified asthma, uncomplicated: Secondary | ICD-10-CM | POA: Diagnosis not present

## 2017-07-30 DIAGNOSIS — Z1212 Encounter for screening for malignant neoplasm of rectum: Secondary | ICD-10-CM | POA: Diagnosis not present

## 2017-08-08 ENCOUNTER — Ambulatory Visit (INDEPENDENT_AMBULATORY_CARE_PROVIDER_SITE_OTHER): Payer: Medicare Other | Admitting: Podiatry

## 2017-08-08 ENCOUNTER — Encounter: Payer: Self-pay | Admitting: Podiatry

## 2017-08-08 VITALS — BP 120/66 | HR 68

## 2017-08-08 DIAGNOSIS — B351 Tinea unguium: Secondary | ICD-10-CM | POA: Diagnosis not present

## 2017-08-08 DIAGNOSIS — L603 Nail dystrophy: Secondary | ICD-10-CM | POA: Diagnosis not present

## 2017-08-08 DIAGNOSIS — M79676 Pain in unspecified toe(s): Secondary | ICD-10-CM

## 2017-08-12 NOTE — Progress Notes (Signed)
   Subjective: 67 year old female presenting today as a new patient with a chief complaint of thickening and discoloration of the right 2nd toe that began about 5 months ago. She has been applying Lotrimin cream with no significant relief. There are no modifying factors noted. She denies any pain. Patient is here for further evaluation and treatment.   Past Medical History:  Diagnosis Date  . Anxiety   . Asthma   . Candidiasis   . Cataracts, bilateral   . CPAP (continuous positive airway pressure) dependence   . Depression   . Distal radius fracture, left   . Dyslipidemia   . 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 Hematuria   . Hypothyroidism   . IBS (irritable bowel syndrome)   . Osteopenia   . Seasonal allergies   . Stress incontinence     Objective: Physical Exam General: The patient is alert and oriented x3 in no acute distress.  Dermatology: Hyperkeratotic, discolored, thickened, onychodystrophy of the right 2nd toenail. Skin is warm, dry and supple bilateral lower extremities. Negative for open lesions or macerations.  Vascular: Palpable pedal pulses bilaterally. No edema or erythema noted. Capillary refill within normal limits.  Neurological: Epicritic and protective threshold grossly intact bilaterally.   Musculoskeletal Exam: Range of motion within normal limits to all pedal and ankle joints bilateral. Muscle strength 5/5 in all groups bilateral.   Assessment: #1 dystrophic nail right 2nd   Plan of Care:  #1 Patient was evaluated. #2 Mechanical debridement of right 2nd nail performed using a nail nipper. Filed with dremel without incident.  #3 Recommended good shoe gear.  #4 Return to clinic as needed.    Edrick Kins, DPM [facility]  Dr. Edrick Kins, [place]                                        [place], [place] [address]                Office [phone]  Fax [phone]

## 2017-09-18 ENCOUNTER — Ambulatory Visit: Payer: Medicare Other | Admitting: Adult Health

## 2017-09-18 DIAGNOSIS — E038 Other specified hypothyroidism: Secondary | ICD-10-CM | POA: Diagnosis not present

## 2017-09-22 ENCOUNTER — Encounter: Payer: Self-pay | Admitting: Neurology

## 2017-09-25 ENCOUNTER — Encounter: Payer: Self-pay | Admitting: Adult Health

## 2017-09-25 ENCOUNTER — Ambulatory Visit (INDEPENDENT_AMBULATORY_CARE_PROVIDER_SITE_OTHER): Payer: Medicare Other | Admitting: Adult Health

## 2017-09-25 VITALS — BP 128/68 | HR 74 | Ht 65.0 in | Wt 238.6 lb

## 2017-09-25 DIAGNOSIS — G4733 Obstructive sleep apnea (adult) (pediatric): Secondary | ICD-10-CM | POA: Diagnosis not present

## 2017-09-25 DIAGNOSIS — Z9989 Dependence on other enabling machines and devices: Secondary | ICD-10-CM

## 2017-09-25 NOTE — Patient Instructions (Signed)
Your Plan:  Continue using CPAP nightly If your symptoms worsen or you develop new symptoms please let us know.   Thank you for coming to see us at [facility]. I hope we have been able to provide you high quality care today.  You may receive a patient satisfaction survey over the next few weeks. We would appreciate your feedback and comments so that we may continue to improve ourselves and the health of our patients.  

## 2017-09-25 NOTE — Progress Notes (Addendum)
PATIENT: Belinda [name] DOB: [birth year]  REASON FOR VISIT: follow up HISTORY FROM: patient  HISTORY OF PRESENT ILLNESS: Today 09/25/17 Belinda [name] is a 67 year old female with a history of obstructive sleep apnea on CPAP.  He returns today for follow-up.  Her CPAP download indicates that she uses machine 28 out of 30 days for compliance of 93%. She used her machine greater than 4 hours 25 out of 30 days for compliance of 83%.  She used her machine on average 5 hours and 22 minutes.  Her residual AHI is 0.9 on AutoSet with a pressure of 4-13 cm of water.  She does not have a significant leak.  She continues to feel that the CPAP works well for her.  She returns today for follow-up.   HISTORY 03/14/17: Belinda [name] is a 67 year old female with a history of obstructive sleep apnea on CPAP.  She returns today for follow-up.  Her download indicates that she uses her machine 27 out of 30 days for compliance of 90%.  She uses her machine greater than 4 hours 21 out of 30 days for compliance of 70%.  On average she uses her machine 5 hours and 32.  Her residual AHI is 1 on a minimum pressure of 4 cm of water and maximum pressure of 13 cm of water.  She has a leak in the 95th percentile at 20.9 L/min.  She reports that she has noticed the benefit with the CPAP.  She states that she does feel that her mass may be too big.  She plans to stop by her DME company this afternoon to have her mask adjusted.  She returns today for an evaluation   REVIEW OF SYSTEMS: Out of a complete 14 system review of symptoms, the patient complains only of the following symptoms, and all other reviewed systems are negative.  ESS 12  ALLERGIES: Allergies  Allergen Reactions  . Penicillins Rash    HOME MEDICATIONS: Outpatient Medications Prior to Visit  Medication Sig Dispense Refill  . ASMANEX 60 METERED DOSES 220 MCG/INH inhaler     . cetirizine (ZYRTEC) 10 MG tablet Take 10 mg by mouth daily.    .  Cholecalciferol (VITAMIN D3) 5000 units CAPS Take 10,000 Units by mouth once a week.     . clindamycin (CLEOCIN) 150 MG capsule TK 4 CS PO ONE HOUR BEFORE APPOINTMENT  1  . levothyroxine (SYNTHROID, LEVOTHROID) 200 MCG tablet Take 400 mcg by mouth once a week.     . methocarbamol (ROBAXIN) 500 MG tablet TK 1-2 TS PO UP TO TID PRF MUSCLE SPASM  0  . NYSTATIN powder APPLY AA BID  2  . sertraline (ZOLOFT) 100 MG tablet Take 150 mg by mouth daily.     . VENTOLIN HFA 108 (90 Base) MCG/ACT inhaler   5   No facility-administered medications prior to visit.     PAST MEDICAL HISTORY: Past Medical History:  Diagnosis Date  . Anxiety   . Asthma   . Candidiasis   . Cataracts, bilateral   . CPAP (continuous positive airway pressure) dependence   . Depression   . Distal radius fracture, left   . Dyslipidemia   . 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 Hematuria   . Hypothyroidism   . IBS (irritable bowel syndrome)   . Osteopenia   . Seasonal allergies   . Stress incontinence     PAST SURGICAL HISTORY: Past Surgical History:  Procedure  Laterality Date  . CARPAL TUNNEL RELEASE Left 10/27/2015   Procedure: LEFT CARPAL TUNNEL RELEASE;  Surgeon: Charlotte Crumb, MD;  Location: [place];  Service: Orthopedics;  Laterality: Left;  . CATARACT EXTRACTION    . CATARACT EXTRACTION  2017   x 2  . JOINT REPLACEMENT Bilateral   . OPEN REDUCTION INTERNAL FIXATION (ORIF) DISTAL RADIAL FRACTURE Left 10/27/2015   Procedure: OPEN REDUCTION INTERNAL FIXATION (ORIF) LEFT  DISTAL RADIAL FRACTURE;  Surgeon: Charlotte Crumb, MD;  Location: [place];  Service: Orthopedics;  Laterality: Left;  . TONSILLECTOMY    . TOTAL KNEE ARTHROPLASTY     x 2    FAMILY HISTORY: Family History  Problem Relation Age of Onset  . Kidney cancer Father   . Colon cancer Maternal Grandmother   . Lung cancer Maternal Grandfather   . Heart attack Maternal Grandfather     SOCIAL  HISTORY: Social History   Socioeconomic History  . Marital status: Divorced    Spouse name: Not on file  . Number of children: Not on file  . Years of education: Not on file  . Highest education level: Not on file  Occupational History  . Not on file  Social Needs  . Financial resource strain: Not on file  . Food insecurity:    Worry: Not on file    Inability: Not on file  . Transportation needs:    Medical: Not on file    Non-medical: Not on file  Tobacco Use  . Smoking status: Never Smoker  . Smokeless tobacco: Never Used  Substance and Sexual Activity  . Alcohol use: Yes    Comment: rarely   . Drug use: No  . Sexual activity: Not on file  Lifestyle  . Physical activity:    Days per week: Not on file    Minutes per session: Not on file  . Stress: Not on file  Relationships  . Social connections:    Talks on phone: Not on file    Gets together: Not on file    Attends religious service: Not on file    Active member of club or organization: Not on file    Attends meetings of clubs or organizations: Not on file    Relationship status: Not on file  . Intimate partner violence:    Fear of current or ex partner: Not on file    Emotionally abused: Not on file    Physically abused: Not on file    Forced sexual activity: Not on file  Other Topics Concern  . Not on file  Social History Narrative  . Not on file      PHYSICAL EXAM  Vitals:   09/25/17 1421  BP: 128/68  Pulse: 74  SpO2: 97%  Weight: 238 lb 9.6 oz (108.2 kg)  Height: 5\' 5"  (1.651 m)   Body mass index is 39.71 kg/m.  Generalized: Well developed, in no acute distress   Neurological examination  Mentation: Alert oriented to time, place, history taking. Follows all commands speech and language fluent Cranial nerve II-XII: Pupils were equal round reactive to light. Extraocular movements were full, visual field were full on confrontational test. Facial sensation and strength were normal. Uvula tongue  midline. Head turning and shoulder shrug  were normal and symmetric.  Neck circumference 14 inches, Mallampati 3+  motor: The motor testing reveals 5 over 5 strength of all 4 extremities. Good symmetric motor tone is noted throughout.  Sensory: Sensory testing is  intact to soft touch on all 4 extremities. No evidence of extinction is noted.  Coordination: Cerebellar testing reveals good finger-nose-finger and heel-to-shin bilaterally.  Gait and station: Gait is normal. Tandem gait is normal. Romberg is negative. No drift is seen.  Reflexes: Deep tendon reflexes are symmetric and normal bilaterally.   DIAGNOSTIC DATA (LABS, IMAGING, TESTING) - I reviewed patient records, labs, notes, testing and imaging myself where available.    ASSESSMENT AND PLAN 67 y.o. year old female  has a past medical history of Anxiety, Asthma, Candidiasis, Cataracts, bilateral, CPAP (continuous positive airway pressure) dependence, Depression, Distal radius fracture, left, Dyslipidemia, Heart murmur, Hematuria, Hypothyroidism, IBS (irritable bowel syndrome), Osteopenia, Seasonal allergies, and Stress incontinence. here with:  1.  Obstructive sleep apnea on CPAP  The patient CPAP download shows excellent compliance and good treatment of her apnea.  She is encouraged to continue using the CPAP nightly.  She is advised that if her symptoms worsen or she develops new symptoms she should let us know.  She will follow-up in 6 months or sooner if needed.  I spent 15 minutes with the patient. 50% of this time was spent patient CPAP download   Ward Givens, MSN, NP-C 09/25/2017, 2:16 PM [facility] [address], [place], [place] [address] [phone]  I reviewed the above note and documentation by the Nurse Practitioner and agree with the history, physical exam, assessment and plan as outlined above. I was immediately available for face-to-face consultation. Star Age, MD, PhD [facility])

## 2018-03-05 DIAGNOSIS — Z23 Encounter for immunization: Secondary | ICD-10-CM | POA: Diagnosis not present

## 2018-03-07 DIAGNOSIS — H2513 Age-related nuclear cataract, bilateral: Secondary | ICD-10-CM | POA: Diagnosis not present

## 2018-03-27 DIAGNOSIS — Z96653 Presence of artificial knee joint, bilateral: Secondary | ICD-10-CM | POA: Diagnosis not present

## 2018-03-27 DIAGNOSIS — Z471 Aftercare following joint replacement surgery: Secondary | ICD-10-CM | POA: Diagnosis not present

## 2018-03-27 DIAGNOSIS — M545 Low back pain: Secondary | ICD-10-CM | POA: Diagnosis not present

## 2018-04-03 DIAGNOSIS — Z6835 Body mass index (BMI) 35.0-35.9, adult: Secondary | ICD-10-CM | POA: Diagnosis not present

## 2018-04-03 DIAGNOSIS — Z01419 Encounter for gynecological examination (general) (routine) without abnormal findings: Secondary | ICD-10-CM | POA: Diagnosis not present

## 2018-04-03 DIAGNOSIS — Z124 Encounter for screening for malignant neoplasm of cervix: Secondary | ICD-10-CM | POA: Diagnosis not present

## 2018-04-03 DIAGNOSIS — N9089 Other specified noninflammatory disorders of vulva and perineum: Secondary | ICD-10-CM | POA: Diagnosis not present

## 2018-04-03 DIAGNOSIS — Z1231 Encounter for screening mammogram for malignant neoplasm of breast: Secondary | ICD-10-CM | POA: Diagnosis not present

## 2018-04-03 DIAGNOSIS — Z6839 Body mass index (BMI) 39.0-39.9, adult: Secondary | ICD-10-CM | POA: Diagnosis not present

## 2018-04-03 DIAGNOSIS — R140 Abdominal distension (gaseous): Secondary | ICD-10-CM | POA: Diagnosis not present

## 2018-04-08 ENCOUNTER — Other Ambulatory Visit: Payer: Self-pay | Admitting: Obstetrics and Gynecology

## 2018-04-08 DIAGNOSIS — K429 Umbilical hernia without obstruction or gangrene: Secondary | ICD-10-CM

## 2018-04-08 DIAGNOSIS — R140 Abdominal distension (gaseous): Secondary | ICD-10-CM

## 2018-04-11 DIAGNOSIS — J069 Acute upper respiratory infection, unspecified: Secondary | ICD-10-CM | POA: Diagnosis not present

## 2018-04-11 DIAGNOSIS — Z6838 Body mass index (BMI) 38.0-38.9, adult: Secondary | ICD-10-CM | POA: Diagnosis not present

## 2018-04-11 DIAGNOSIS — R05 Cough: Secondary | ICD-10-CM | POA: Diagnosis not present

## 2018-04-29 ENCOUNTER — Ambulatory Visit
Admission: RE | Admit: 2018-04-29 | Discharge: 2018-04-29 | Disposition: A | Discharge status: Home / Self Care | Payer: Medicare Other | Source: Ambulatory Visit | Attending: Obstetrics and Gynecology | Admitting: Obstetrics and Gynecology

## 2018-04-29 DIAGNOSIS — R140 Abdominal distension (gaseous): Secondary | ICD-10-CM | POA: Diagnosis not present

## 2018-04-29 DIAGNOSIS — K429 Umbilical hernia without obstruction or gangrene: Secondary | ICD-10-CM

## 2018-06-24 DIAGNOSIS — J45909 Unspecified asthma, uncomplicated: Secondary | ICD-10-CM | POA: Diagnosis not present

## 2018-06-24 DIAGNOSIS — G4733 Obstructive sleep apnea (adult) (pediatric): Secondary | ICD-10-CM | POA: Diagnosis not present

## 2018-06-24 DIAGNOSIS — R05 Cough: Secondary | ICD-10-CM | POA: Diagnosis not present

## 2018-06-24 DIAGNOSIS — J069 Acute upper respiratory infection, unspecified: Secondary | ICD-10-CM | POA: Diagnosis not present

## 2018-08-27 DIAGNOSIS — E038 Other specified hypothyroidism: Secondary | ICD-10-CM | POA: Diagnosis not present

## 2018-08-27 DIAGNOSIS — E7849 Other hyperlipidemia: Secondary | ICD-10-CM | POA: Diagnosis not present

## 2018-08-27 DIAGNOSIS — M859 Disorder of bone density and structure, unspecified: Secondary | ICD-10-CM | POA: Diagnosis not present

## 2018-08-27 DIAGNOSIS — R7301 Impaired fasting glucose: Secondary | ICD-10-CM | POA: Diagnosis not present

## 2018-09-10 DIAGNOSIS — J45909 Unspecified asthma, uncomplicated: Secondary | ICD-10-CM | POA: Diagnosis not present

## 2018-09-10 DIAGNOSIS — E039 Hypothyroidism, unspecified: Secondary | ICD-10-CM | POA: Diagnosis not present

## 2018-09-10 DIAGNOSIS — E669 Obesity, unspecified: Secondary | ICD-10-CM | POA: Diagnosis not present

## 2018-09-10 DIAGNOSIS — F329 Major depressive disorder, single episode, unspecified: Secondary | ICD-10-CM | POA: Diagnosis not present

## 2018-09-10 DIAGNOSIS — Z0000 Encounter for general adult medical examination without abnormal findings: Secondary | ICD-10-CM | POA: Diagnosis not present

## 2018-09-10 DIAGNOSIS — F900 Attention-deficit hyperactivity disorder, predominantly inattentive type: Secondary | ICD-10-CM | POA: Diagnosis not present

## 2018-09-10 DIAGNOSIS — E785 Hyperlipidemia, unspecified: Secondary | ICD-10-CM | POA: Diagnosis not present

## 2018-09-10 DIAGNOSIS — G4733 Obstructive sleep apnea (adult) (pediatric): Secondary | ICD-10-CM | POA: Diagnosis not present

## 2018-09-10 DIAGNOSIS — R7301 Impaired fasting glucose: Secondary | ICD-10-CM | POA: Diagnosis not present

## 2018-09-10 DIAGNOSIS — M8580 Other specified disorders of bone density and structure, unspecified site: Secondary | ICD-10-CM | POA: Diagnosis not present

## 2018-09-10 DIAGNOSIS — Z1331 Encounter for screening for depression: Secondary | ICD-10-CM | POA: Diagnosis not present

## 2018-09-10 DIAGNOSIS — B351 Tinea unguium: Secondary | ICD-10-CM | POA: Diagnosis not present

## 2018-09-10 DIAGNOSIS — R3121 Asymptomatic microscopic hematuria: Secondary | ICD-10-CM | POA: Diagnosis not present

## 2018-09-23 DIAGNOSIS — R82998 Other abnormal findings in urine: Secondary | ICD-10-CM | POA: Diagnosis not present

## 2018-09-30 ENCOUNTER — Ambulatory Visit: Payer: Medicare Other | Admitting: Adult Health

## 2018-10-16 ENCOUNTER — Telehealth: Payer: Self-pay | Admitting: Adult Health

## 2018-10-16 ENCOUNTER — Other Ambulatory Visit: Payer: Self-pay

## 2018-10-16 ENCOUNTER — Ambulatory Visit: Payer: PRIVATE HEALTH INSURANCE | Admitting: Adult Health

## 2018-10-16 NOTE — Telephone Encounter (Signed)
Noted  

## 2018-10-16 NOTE — Telephone Encounter (Signed)
Pt has called stating she was never told she needed to check in 1/2 an hour before her appointment.  Pt states she should have received a call or something in the mail telling her that.  Pt is asking for a call from NP [facility].  Pt was told that NP Jinny Blossom is with patients but that a message would be sent asking that she receives a call back

## 2018-10-16 NOTE — Telephone Encounter (Signed)
Pt called in demanding to speak with Meagan advised pt Meagan is in with a patient, also explained to pt the busyness our office is experiencing today due to seeing pts in office today , pt then states that someone can stop what they are doing and call her, I advised her we can not leave our patients waiting due to having back to back patients, encouraged patient to wait on call back from Zeba , pt stated she will hold I then advised pt that she was unable to hold Meagan is with a pt and will have to call her back, she then stated ok and we disconnected line.

## 2018-10-16 NOTE — Telephone Encounter (Signed)
Pt has called back and stated moments before she spoke with someone who told her she could hold while NP Megan finished with her current patient.  It was explained to pt that NP Jinny Blossom is with patients throughout the afternoon and that there is an allowance of 48 hours for calls to be returned.  Pt stated she has waited all day and she insist on speaking with someone.  Pt went on to say that she will just come to the office and it will not be pretty. Pt was made aware that what she said about coming to the office would be documented.

## 2018-10-17 ENCOUNTER — Ambulatory Visit (INDEPENDENT_AMBULATORY_CARE_PROVIDER_SITE_OTHER): Payer: Medicare Other | Admitting: Adult Health

## 2018-10-17 ENCOUNTER — Encounter: Payer: Self-pay | Admitting: Adult Health

## 2018-10-17 ENCOUNTER — Other Ambulatory Visit: Payer: Self-pay

## 2018-10-17 VITALS — BP 121/71 | HR 77 | Temp 97.8°F | Ht 65.0 in | Wt 257.2 lb

## 2018-10-17 DIAGNOSIS — Z9989 Dependence on other enabling machines and devices: Secondary | ICD-10-CM | POA: Diagnosis not present

## 2018-10-17 DIAGNOSIS — G4733 Obstructive sleep apnea (adult) (pediatric): Secondary | ICD-10-CM | POA: Diagnosis not present

## 2018-10-17 NOTE — Telephone Encounter (Signed)
Late entry: 10/16/2018 at 4:00PM. I called and spoke to the patient and explained out late policy

## 2018-10-17 NOTE — Progress Notes (Signed)
PATIENT: Belinda [name] DOB: 11/06/1950  REASON FOR VISIT: follow up HISTORY FROM: patient  HISTORY OF PRESENT ILLNESS: Today 10/17/18:  Belinda [name] is a 68 year old female with a history of obstructive sleep apnea on CPAP.  She returns today for follow-up.  Her CPAP download indicates that she used her machine 27 out of 30 days for compliance of 90%.  She used her machine greater than 4 hours 21 days for compliance of 70%.  On average she uses her machine 5 hours and 17 minutes.  Her residual AHI is 0.7 on 4 to 13 cm of water with EPR of 3.  She does not have a significant leak.  She states that CPAP continues to work well for her.  Epworth sleepiness score is 8 and fatigue severity score is 20.  She returns today for follow-up  HISTORY 09/25/17 Belinda [name] is a 68 year old female with a history of obstructive sleep apnea on CPAP.  He returns today for follow-up.  Her CPAP download indicates that she uses machine 28 out of 30 days for compliance of 93%. She used her machine greater than 4 hours 25 out of 30 days for compliance of 83%.  She used her machine on average 5 hours and 22 minutes.  Her residual AHI is 0.9 on AutoSet with a pressure of 4-13 cm of water.  She does not have a significant leak.  She continues to feel that the CPAP works well for her.  She returns today for follow-up.  REVIEW OF SYSTEMS: Out of a complete 14 system review of symptoms, the patient complains only of the following symptoms, and all other reviewed systems are negative.  See HPI  ALLERGIES: Allergies  Allergen Reactions  . Penicillins Rash    HOME MEDICATIONS: Outpatient Medications Prior to Visit  Medication Sig Dispense Refill  . ASMANEX 60 METERED DOSES 220 MCG/INH inhaler Inhale 2 puffs into the lungs daily as needed.     . cetirizine (ZYRTEC) 10 MG tablet Take 10 mg by mouth daily.    . Cholecalciferol (VITAMIN D3) 5000 units CAPS Take 10,000 Units by mouth once a week.     . clindamycin  (CLEOCIN) 150 MG capsule TK 4 CS PO ONE HOUR BEFORE APPOINTMENT  1  . levothyroxine (SYNTHROID, LEVOTHROID) 200 MCG tablet Take 200 mcg by mouth daily before breakfast. For 6 days a week.    . NYSTATIN powder APPLY AA BID  2  . sertraline (ZOLOFT) 100 MG tablet Take 150 mg by mouth daily.     . VENTOLIN HFA 108 (90 Base) MCG/ACT inhaler Inhale 1-2 puffs into the lungs every 4 (four) hours as needed.   5   No facility-administered medications prior to visit.     PAST MEDICAL HISTORY: Past Medical History:  Diagnosis Date  . Anxiety   . Asthma   . Candidiasis   . Cataracts, bilateral   . CPAP (continuous positive airway pressure) dependence   . Depression   . Distal radius fracture, left   . Dyslipidemia   . 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 Hematuria   . Hypothyroidism   . IBS (irritable bowel syndrome)   . Osteopenia   . Seasonal allergies   . Stress incontinence     PAST SURGICAL HISTORY: Past Surgical History:  Procedure Laterality Date  . CARPAL TUNNEL RELEASE Left 10/27/2015   Procedure: LEFT CARPAL TUNNEL RELEASE;  Surgeon: Charlotte Crumb, MD;  [facility];  Service: Orthopedics;  Laterality: Left;  . CATARACT EXTRACTION    . CATARACT EXTRACTION  2017   x 2  . JOINT REPLACEMENT Bilateral   . OPEN REDUCTION INTERNAL FIXATION (ORIF) DISTAL RADIAL FRACTURE Left 10/27/2015   Procedure: OPEN REDUCTION INTERNAL FIXATION (ORIF) LEFT  DISTAL RADIAL FRACTURE;  Surgeon: Charlotte Crumb, MD;  Location: [place];  Service: Orthopedics;  Laterality: Left;  . TONSILLECTOMY    . TOTAL KNEE ARTHROPLASTY     x 2    FAMILY HISTORY: Family History  Problem Relation Age of Onset  . Kidney cancer Father   . Colon cancer Maternal Grandmother   . Lung cancer Maternal Grandfather   . Heart attack Maternal Grandfather     SOCIAL HISTORY: Social History   Socioeconomic History  . Marital status: Divorced    Spouse name:  Not on file  . Number of children: Not on file  . Years of education: Not on file  . Highest education level: Not on file  Occupational History  . Not on file  Social Needs  . Financial resource strain: Not on file  . Food insecurity    Worry: Not on file    Inability: Not on file  . Transportation needs    Medical: Not on file    Non-medical: Not on file  Tobacco Use  . Smoking status: Never Smoker  . Smokeless tobacco: Never Used  Substance and Sexual Activity  . Alcohol use: Yes    Comment: rarely   . Drug use: No  . Sexual activity: Not on file  Lifestyle  . Physical activity    Days per week: Not on file    Minutes per session: Not on file  . Stress: Not on file  Relationships  . Social Herbalist on phone: Not on file    Gets together: Not on file    Attends religious service: Not on file    Active member of club or organization: Not on file    Attends meetings of clubs or organizations: Not on file    Relationship status: Not on file  . Intimate partner violence    Fear of current or ex partner: Not on file    Emotionally abused: Not on file    Physically abused: Not on file    Forced sexual activity: Not on file  Other Topics Concern  . Not on file  Social History Narrative  . Not on file      PHYSICAL EXAM  Vitals:   10/17/18 1038  BP: 121/71  Pulse: 77  Temp: 97.8 F (36.6 C)  TempSrc: Oral  Weight: 257 lb 3.2 oz (116.7 kg)  Height: 5\' 5"  (1.651 m)   Body mass index is 42.8 kg/m.  Generalized: Well developed, in no acute distress  Chest: Lungs clear to auscultation  Neurological examination  Mentation: Alert oriented to time, place, history taking. Follows all commands speech and language fluent Cranial nerve II-XII: Pupils were equal round reactive to light. Extraocular movements were full, visual field were full on confrontational test. Facial sensation and strength were normal. Uvula tongue midline. Head turning and shoulder  shrug  were normal and symmetric. Motor: The motor testing reveals 5 over 5 strength of all 4 extremities. Good symmetric motor tone is noted throughout.  Sensory: Sensory testing is intact to soft touch on all 4 extremities. No evidence of extinction is noted.  Gait and station: Gait is normal.  DIAGNOSTIC DATA (LABS, IMAGING, TESTING) - I reviewed patient records, labs, notes, testing and imaging myself where available.    ASSESSMENT AND PLAN 68 y.o. year old female  has a past medical history of Anxiety, Asthma, Candidiasis, Cataracts, bilateral, CPAP (continuous positive airway pressure) dependence, Depression, Distal radius fracture, left, Dyslipidemia, Heart murmur, Hematuria, Hypothyroidism, IBS (irritable bowel syndrome), Osteopenia, Seasonal allergies, and Stress incontinence. here with:  1.  Obstructive sleep apnea on CPAP  Patient CPAP download shows excellent compliance and good treatment of her apnea.  She is encouraged to continue using CPAP nightly and greater than 4 hours each night.  She is advised that if her symptoms worsen or she develops new symptoms she should let us know.  She will follow-up in 6 months or sooner if needed  I spent 15 minutes with the patient. 50% of this time was spent reviewing CPAP download   Ward Givens, MSN, NP-C 10/17/2018, 11:13 AM [facility] [address] [place],  [address] [phone]

## 2018-10-17 NOTE — Patient Instructions (Signed)

## 2018-10-21 ENCOUNTER — Ambulatory Visit: Payer: Medicare Other | Admitting: Adult Health

## 2018-12-20 ENCOUNTER — Other Ambulatory Visit: Payer: Self-pay

## 2018-12-20 DIAGNOSIS — R6889 Other general symptoms and signs: Secondary | ICD-10-CM | POA: Diagnosis not present

## 2018-12-20 DIAGNOSIS — Z20822 Contact with and (suspected) exposure to covid-19: Secondary | ICD-10-CM

## 2018-12-21 LAB — NOVEL CORONAVIRUS, NAA: SARS-CoV-2, NAA: NOT DETECTED

## 2019-01-17 DIAGNOSIS — Z23 Encounter for immunization: Secondary | ICD-10-CM | POA: Diagnosis not present

## 2019-04-16 ENCOUNTER — Other Ambulatory Visit: Payer: Self-pay | Admitting: Obstetrics and Gynecology

## 2019-04-16 DIAGNOSIS — R928 Other abnormal and inconclusive findings on diagnostic imaging of breast: Secondary | ICD-10-CM

## 2019-04-21 ENCOUNTER — Other Ambulatory Visit: Payer: Self-pay

## 2019-04-21 ENCOUNTER — Ambulatory Visit
Admission: RE | Admit: 2019-04-21 | Discharge: 2019-04-21 | Disposition: A | Discharge status: Home / Self Care | Payer: Medicare Other | Source: Ambulatory Visit | Attending: Obstetrics and Gynecology | Admitting: Obstetrics and Gynecology

## 2019-04-21 ENCOUNTER — Ambulatory Visit: Payer: Medicare Other

## 2019-04-21 DIAGNOSIS — R928 Other abnormal and inconclusive findings on diagnostic imaging of breast: Secondary | ICD-10-CM | POA: Diagnosis not present

## 2019-09-16 DIAGNOSIS — E7849 Other hyperlipidemia: Secondary | ICD-10-CM | POA: Diagnosis not present

## 2019-09-16 DIAGNOSIS — E038 Other specified hypothyroidism: Secondary | ICD-10-CM | POA: Diagnosis not present

## 2019-09-16 DIAGNOSIS — R7301 Impaired fasting glucose: Secondary | ICD-10-CM | POA: Diagnosis not present

## 2019-09-16 DIAGNOSIS — M859 Disorder of bone density and structure, unspecified: Secondary | ICD-10-CM | POA: Diagnosis not present

## 2019-09-22 DIAGNOSIS — R82998 Other abnormal findings in urine: Secondary | ICD-10-CM | POA: Diagnosis not present

## 2019-10-01 DIAGNOSIS — M7541 Impingement syndrome of right shoulder: Secondary | ICD-10-CM | POA: Diagnosis not present

## 2019-10-22 ENCOUNTER — Telehealth (INDEPENDENT_AMBULATORY_CARE_PROVIDER_SITE_OTHER): Payer: Medicare Other | Admitting: Adult Health

## 2019-10-22 DIAGNOSIS — Z9989 Dependence on other enabling machines and devices: Secondary | ICD-10-CM | POA: Diagnosis not present

## 2019-10-22 DIAGNOSIS — G4733 Obstructive sleep apnea (adult) (pediatric): Secondary | ICD-10-CM | POA: Diagnosis not present

## 2019-10-22 NOTE — Progress Notes (Signed)
PATIENT: Belinda [name] DOB: 07/12/1950  REASON FOR VISIT: follow up HISTORY FROM: patient  Virtual Visit via Video Note  I connected with [name] on 10/22/19 at  2:30 PM EDT by a video enabled telemedicine application located remotely at [facility] and verified that I am speaking with the correct person using two identifiers who was located at their own home.   I discussed the limitations of evaluation and management by telemedicine and the availability of in person appointments. The patient expressed understanding and agreed to proceed.   PATIENT: Belinda [name] DOB: 1950-10-11  REASON FOR VISIT: follow up HISTORY FROM: patient  HISTORY OF PRESENT ILLNESS: Today 10/22/19: Ms. Berneta Sages is a 69 year old female with a history of obstructive sleep apnea on CPAP.  She returns today for follow-up.  Her download indicates that she use her machine 26 out of 30 days for compliance of 87%.  She used her machine greater than 4 hours 14 days for compliance of 47%.  On average she uses her machine 4 hours and 34 minutes.  Her residual AHI is 0.9 on 4 to 13 cm of water with EPR of 3.  Leak in the 95th percentile is 14.3 L/min.  HISTORY 10/17/18:  Ms. Dau is a 69 year old female with a history of obstructive sleep apnea on CPAP.  She returns today for follow-up.  Her CPAP download indicates that she used her machine 27 out of 30 days for compliance of 90%.  She used her machine greater than 4 hours 21 days for compliance of 70%.  On average she uses her machine 5 hours and 17 minutes.  Her residual AHI is 0.7 on 4 to 13 cm of water with EPR of 3.  She does not have a significant leak.  She states that CPAP continues to work well for her.  Epworth sleepiness score is 8 and fatigue severity score is 20.  She returns today for follow-up  REVIEW OF SYSTEMS: Out of a complete 14 system review of symptoms, the patient complains only of the following symptoms, and  all other reviewed systems are negative.  See HPI   ALLERGIES: Allergies  Allergen Reactions  . Penicillins Rash    HOME MEDICATIONS: Outpatient Medications Prior to Visit  Medication Sig Dispense Refill  . ASMANEX 60 METERED DOSES 220 MCG/INH inhaler Inhale 2 puffs into the lungs daily as needed.     . cetirizine (ZYRTEC) 10 MG tablet Take 10 mg by mouth daily.    . Cholecalciferol (VITAMIN D3) 5000 units CAPS Take 10,000 Units by mouth once a week.     . clindamycin (CLEOCIN) 150 MG capsule TK 4 CS PO ONE HOUR BEFORE APPOINTMENT  1  . levothyroxine (SYNTHROID, LEVOTHROID) 200 MCG tablet Take 200 mcg by mouth daily before breakfast. For 6 days a week.    . NYSTATIN powder APPLY AA BID  2  . sertraline (ZOLOFT) 100 MG tablet Take 150 mg by mouth daily.     . VENTOLIN HFA 108 (90 Base) MCG/ACT inhaler Inhale 1-2 puffs into the lungs every 4 (four) hours as needed.   5   No facility-administered medications prior to visit.    PAST MEDICAL HISTORY: Past Medical History:  Diagnosis Date  . Anxiety   . Asthma   . Candidiasis   . Cataracts, bilateral   . CPAP (continuous positive airway pressure) dependence   . Depression   . Distal radius fracture, left   . Dyslipidemia   .  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 Hematuria   . Hypothyroidism   . IBS (irritable bowel syndrome)   . Osteopenia   . Seasonal allergies   . Stress incontinence     PAST SURGICAL HISTORY: Past Surgical History:  Procedure Laterality Date  . CARPAL TUNNEL RELEASE Left 10/27/2015   Procedure: LEFT CARPAL TUNNEL RELEASE;  Surgeon: Charlotte Crumb, MD;  Location: [facility];  Service: Orthopedics;  Laterality: Left;  . CATARACT EXTRACTION    . CATARACT EXTRACTION  2017   x 2  . JOINT REPLACEMENT Bilateral   . OPEN REDUCTION INTERNAL FIXATION (ORIF) DISTAL RADIAL FRACTURE Left 10/27/2015   Procedure: OPEN REDUCTION INTERNAL FIXATION (ORIF) LEFT  DISTAL RADIAL FRACTURE;   Surgeon: Charlotte Crumb, MD;  Location: [facility];  Service: Orthopedics;  Laterality: Left;  . TONSILLECTOMY    . TOTAL KNEE ARTHROPLASTY     x 2    FAMILY HISTORY: Family History  Problem Relation Age of Onset  . Kidney cancer Father   . Colon cancer Maternal Grandmother   . Lung cancer Maternal Grandfather   . Heart attack Maternal Grandfather     SOCIAL HISTORY: Social History   Socioeconomic History  . Marital status: Divorced    Spouse name: Not on file  . Number of children: Not on file  . Years of education: Not on file  . Highest education level: Not on file  Occupational History  . Not on file  Tobacco Use  . Smoking status: Never Smoker  . Smokeless tobacco: Never Used  Substance and Sexual Activity  . Alcohol use: Yes    Comment: rarely   . Drug use: No  . Sexual activity: Not on file  Other Topics Concern  . Not on file  Social History Narrative  . Not on file   Social Determinants of Health   Financial Resource Strain:   . Difficulty of Paying Living Expenses:   Food Insecurity:   . Worried About Charity fundraiser in the Last Year:   . Arboriculturist in the Last Year:   Transportation Needs:   . Film/video editor (Medical):   Marland Kitchen Lack of Transportation (Non-Medical):   Physical Activity:   . Days of Exercise per Week:   . Minutes of Exercise per Session:   Stress:   . Feeling of Stress :   Social Connections:   . Frequency of Communication with Friends and Family:   . Frequency of Social Gatherings with Friends and Family:   . Attends Religious Services:   . Active Member of Clubs or Organizations:   . Attends Archivist Meetings:   Marland Kitchen Marital Status:   Intimate Partner Violence:   . Fear of Current or Ex-Partner:   . Emotionally Abused:   Marland Kitchen Physically Abused:   . Sexually Abused:       PHYSICAL EXAM Generalized: Well developed, in no acute distress   Neurological examination  Mentation: Alert  oriented to time, place, history taking. Follows all commands speech and language fluent Cranial nerve II-XII:Extraocular movements were full. Facial symmetry noted. uvula tongue midline. Head turning and shoulder shrug  were normal and symmetric. Motor: Good strength throughout subjectively per patient Sensory: Sensory testing is intact to soft touch on all 4 extremities subjectively per patient Coordination: Cerebellar testing reveals good finger-nose-finger  Gait and station: Patient is able to stand from a seated position. gait is normal.  Reflexes: UTA  DIAGNOSTIC DATA (LABS, IMAGING, TESTING) - I reviewed patient records, labs, notes, testing and imaging myself where available.     ASSESSMENT AND PLAN 69 y.o. year old female  has a past medical history of Anxiety, Asthma, Candidiasis, Cataracts, bilateral, CPAP (continuous positive airway pressure) dependence, Depression, Distal radius fracture, left, Dyslipidemia, Heart murmur, Hematuria, Hypothyroidism, IBS (irritable bowel syndrome), Osteopenia, Seasonal allergies, and Stress incontinence. here with:  OSA on CPAP  . CPAP compliance suboptimal . Residual AHI is good . Encouraged patient to continue using CPAP nightly and > 4 hours each night . F/U in 1 year or sooner if needed  I spent 20 minutes of face-to-face and non-face-to-face time with patient.  This included previsit chart review, lab review, study review, order entry, electronic health record documentation, patient education.  Ward Givens, MSN, NP-C 10/22/2019, 2:17 PM [facility] [address] [place], [place] [address] [phone]

## 2020-02-05 DIAGNOSIS — R059 Cough, unspecified: Secondary | ICD-10-CM | POA: Diagnosis not present

## 2020-02-05 DIAGNOSIS — J302 Other seasonal allergic rhinitis: Secondary | ICD-10-CM | POA: Diagnosis not present

## 2020-02-05 DIAGNOSIS — Z1152 Encounter for screening for COVID-19: Secondary | ICD-10-CM | POA: Diagnosis not present

## 2020-02-05 DIAGNOSIS — J40 Bronchitis, not specified as acute or chronic: Secondary | ICD-10-CM | POA: Diagnosis not present

## 2020-03-16 DIAGNOSIS — Z23 Encounter for immunization: Secondary | ICD-10-CM | POA: Diagnosis not present

## 2020-03-22 DIAGNOSIS — R7301 Impaired fasting glucose: Secondary | ICD-10-CM | POA: Diagnosis not present

## 2020-03-22 DIAGNOSIS — G4733 Obstructive sleep apnea (adult) (pediatric): Secondary | ICD-10-CM | POA: Diagnosis not present

## 2020-03-22 DIAGNOSIS — M1990 Unspecified osteoarthritis, unspecified site: Secondary | ICD-10-CM | POA: Diagnosis not present

## 2020-03-22 DIAGNOSIS — Z23 Encounter for immunization: Secondary | ICD-10-CM | POA: Diagnosis not present

## 2020-03-22 DIAGNOSIS — E039 Hypothyroidism, unspecified: Secondary | ICD-10-CM | POA: Diagnosis not present

## 2020-03-22 DIAGNOSIS — F900 Attention-deficit hyperactivity disorder, predominantly inattentive type: Secondary | ICD-10-CM | POA: Diagnosis not present

## 2020-03-22 DIAGNOSIS — F329 Major depressive disorder, single episode, unspecified: Secondary | ICD-10-CM | POA: Diagnosis not present

## 2020-03-22 DIAGNOSIS — J45909 Unspecified asthma, uncomplicated: Secondary | ICD-10-CM | POA: Diagnosis not present

## 2020-03-22 DIAGNOSIS — E785 Hyperlipidemia, unspecified: Secondary | ICD-10-CM | POA: Diagnosis not present

## 2020-03-22 DIAGNOSIS — B379 Candidiasis, unspecified: Secondary | ICD-10-CM | POA: Diagnosis not present

## 2020-04-12 DIAGNOSIS — Z20822 Contact with and (suspected) exposure to covid-19: Secondary | ICD-10-CM | POA: Diagnosis not present

## 2020-04-28 ENCOUNTER — Encounter: Payer: Self-pay | Admitting: Adult Health

## 2020-06-08 DIAGNOSIS — Z01812 Encounter for preprocedural laboratory examination: Secondary | ICD-10-CM | POA: Diagnosis not present

## 2020-06-11 DIAGNOSIS — Z86010 Personal history of colonic polyps: Secondary | ICD-10-CM | POA: Diagnosis not present

## 2020-06-11 DIAGNOSIS — D122 Benign neoplasm of ascending colon: Secondary | ICD-10-CM | POA: Diagnosis not present

## 2020-06-11 DIAGNOSIS — Z800 Family history of malignant neoplasm of digestive organs: Secondary | ICD-10-CM | POA: Diagnosis not present

## 2020-06-14 DIAGNOSIS — Z1152 Encounter for screening for COVID-19: Secondary | ICD-10-CM | POA: Diagnosis not present

## 2020-06-14 DIAGNOSIS — J069 Acute upper respiratory infection, unspecified: Secondary | ICD-10-CM | POA: Diagnosis not present

## 2020-06-14 DIAGNOSIS — R059 Cough, unspecified: Secondary | ICD-10-CM | POA: Diagnosis not present

## 2020-06-15 DIAGNOSIS — D122 Benign neoplasm of ascending colon: Secondary | ICD-10-CM | POA: Diagnosis not present

## 2020-08-23 DIAGNOSIS — Q792 Exomphalos: Secondary | ICD-10-CM | POA: Diagnosis not present

## 2020-08-23 DIAGNOSIS — Z01419 Encounter for gynecological examination (general) (routine) without abnormal findings: Secondary | ICD-10-CM | POA: Diagnosis not present

## 2020-08-23 DIAGNOSIS — Z6835 Body mass index (BMI) 35.0-35.9, adult: Secondary | ICD-10-CM | POA: Diagnosis not present

## 2020-08-23 DIAGNOSIS — N841 Polyp of cervix uteri: Secondary | ICD-10-CM | POA: Diagnosis not present

## 2020-08-23 DIAGNOSIS — Z1231 Encounter for screening mammogram for malignant neoplasm of breast: Secondary | ICD-10-CM | POA: Diagnosis not present

## 2020-08-23 DIAGNOSIS — B372 Candidiasis of skin and nail: Secondary | ICD-10-CM | POA: Diagnosis not present

## 2020-08-23 DIAGNOSIS — Z01411 Encounter for gynecological examination (general) (routine) with abnormal findings: Secondary | ICD-10-CM | POA: Diagnosis not present

## 2020-08-23 DIAGNOSIS — K429 Umbilical hernia without obstruction or gangrene: Secondary | ICD-10-CM | POA: Diagnosis not present

## 2020-08-23 DIAGNOSIS — Z124 Encounter for screening for malignant neoplasm of cervix: Secondary | ICD-10-CM | POA: Diagnosis not present

## 2020-08-23 DIAGNOSIS — Z6839 Body mass index (BMI) 39.0-39.9, adult: Secondary | ICD-10-CM | POA: Diagnosis not present

## 2020-08-23 DIAGNOSIS — Z6841 Body Mass Index (BMI) 40.0 and over, adult: Secondary | ICD-10-CM | POA: Diagnosis not present

## 2020-09-03 DIAGNOSIS — R059 Cough, unspecified: Secondary | ICD-10-CM | POA: Diagnosis not present

## 2020-09-03 DIAGNOSIS — J069 Acute upper respiratory infection, unspecified: Secondary | ICD-10-CM | POA: Diagnosis not present

## 2020-09-03 DIAGNOSIS — Z1152 Encounter for screening for COVID-19: Secondary | ICD-10-CM | POA: Diagnosis not present

## 2020-09-22 DIAGNOSIS — E039 Hypothyroidism, unspecified: Secondary | ICD-10-CM | POA: Diagnosis not present

## 2020-09-22 DIAGNOSIS — R7301 Impaired fasting glucose: Secondary | ICD-10-CM | POA: Diagnosis not present

## 2020-09-22 DIAGNOSIS — E785 Hyperlipidemia, unspecified: Secondary | ICD-10-CM | POA: Diagnosis not present

## 2020-09-22 DIAGNOSIS — M859 Disorder of bone density and structure, unspecified: Secondary | ICD-10-CM | POA: Diagnosis not present

## 2020-09-22 DIAGNOSIS — Z0000 Encounter for general adult medical examination without abnormal findings: Secondary | ICD-10-CM | POA: Diagnosis not present

## 2020-09-29 DIAGNOSIS — E039 Hypothyroidism, unspecified: Secondary | ICD-10-CM | POA: Diagnosis not present

## 2020-09-29 DIAGNOSIS — J45909 Unspecified asthma, uncomplicated: Secondary | ICD-10-CM | POA: Diagnosis not present

## 2020-09-29 DIAGNOSIS — R7301 Impaired fasting glucose: Secondary | ICD-10-CM | POA: Diagnosis not present

## 2020-09-29 DIAGNOSIS — Z1331 Encounter for screening for depression: Secondary | ICD-10-CM | POA: Diagnosis not present

## 2020-09-29 DIAGNOSIS — Z23 Encounter for immunization: Secondary | ICD-10-CM | POA: Diagnosis not present

## 2020-09-29 DIAGNOSIS — M8580 Other specified disorders of bone density and structure, unspecified site: Secondary | ICD-10-CM | POA: Diagnosis not present

## 2020-09-29 DIAGNOSIS — F329 Major depressive disorder, single episode, unspecified: Secondary | ICD-10-CM | POA: Diagnosis not present

## 2020-09-29 DIAGNOSIS — M1990 Unspecified osteoarthritis, unspecified site: Secondary | ICD-10-CM | POA: Diagnosis not present

## 2020-09-29 DIAGNOSIS — E785 Hyperlipidemia, unspecified: Secondary | ICD-10-CM | POA: Diagnosis not present

## 2020-09-29 DIAGNOSIS — G4733 Obstructive sleep apnea (adult) (pediatric): Secondary | ICD-10-CM | POA: Diagnosis not present

## 2020-09-29 DIAGNOSIS — Z0000 Encounter for general adult medical examination without abnormal findings: Secondary | ICD-10-CM | POA: Diagnosis not present

## 2020-10-05 ENCOUNTER — Other Ambulatory Visit: Payer: Self-pay | Admitting: Internal Medicine

## 2020-10-05 DIAGNOSIS — E785 Hyperlipidemia, unspecified: Secondary | ICD-10-CM

## 2020-10-28 ENCOUNTER — Ambulatory Visit
Admission: RE | Admit: 2020-10-28 | Discharge: 2020-10-28 | Disposition: A | Discharge status: Home / Self Care | Payer: Medicare Other | Source: Ambulatory Visit | Attending: Internal Medicine | Admitting: Internal Medicine

## 2020-10-28 DIAGNOSIS — E785 Hyperlipidemia, unspecified: Secondary | ICD-10-CM

## 2020-11-09 ENCOUNTER — Telehealth: Payer: Medicare Other | Admitting: Adult Health

## 2020-11-16 ENCOUNTER — Encounter: Payer: Self-pay | Admitting: *Deleted

## 2020-11-16 ENCOUNTER — Telehealth (INDEPENDENT_AMBULATORY_CARE_PROVIDER_SITE_OTHER): Payer: Medicare Other | Admitting: Adult Health

## 2020-11-16 DIAGNOSIS — Z9989 Dependence on other enabling machines and devices: Secondary | ICD-10-CM | POA: Diagnosis not present

## 2020-11-16 DIAGNOSIS — G4733 Obstructive sleep apnea (adult) (pediatric): Secondary | ICD-10-CM

## 2020-11-16 NOTE — Progress Notes (Signed)
  [facility] [address] [place] [place] [address] [phone]  PRIMARY NEUROLOGIST:    Virtual Visit via Telephone Note  I connected with Belinda [name] on 11/16/20 at  1:45 PM EDT by telephone located remotely at [facility] and verified that I am speaking with the correct person using two identifiers who reports being located home.     Visit scheduled by RN.  She discussed the limitations, risks, security and privacy concerns of performing an evaluation and management service by telephone and the availability of in person appointments. I also discussed with the patient that there may be a patient responsible charge related to this service. The patient expressed understanding and agreed to proceed. See telephone note for consent and additional scheduling information.    History of Present Illness:  Belinda [name] is a 70 y.o. female who has been followed in this office for OSA on CPAP.  She reports that the CPAP is working well.  She occasionally will feel the mask leaking.  But overall denies any new issues.  She was originally scheduled for a video visit but was unable to connect the video.  She was transitioned to a telephone visit  ESS 11    Observations/Objective:  Generalized: Well developed, in no acute distress   Neurological examination  Mentation: Alert oriented to time, place, history taking. Follows all commands speech and language fluent  Assessment and Plan:  1: Obstructive sleep apnea on CPAP  -Continue using CPAP nightly and greater than 4 hours each night -Download indicates good compliance and treatment of apnea -Follow-up with DME company and mask refitting as needed   Follow Up Instructions:   F/U in 1 year or sooner if needed     I provided 10 minutes of non-face-to-face time during this encounter.    Ward Givens NP-C  [facility] [address] [place], [place]  [address]  Phone [phone] Fax [phone] \

## 2020-11-19 DIAGNOSIS — Z23 Encounter for immunization: Secondary | ICD-10-CM | POA: Diagnosis not present

## 2020-11-22 DIAGNOSIS — Z6841 Body Mass Index (BMI) 40.0 and over, adult: Secondary | ICD-10-CM | POA: Diagnosis not present

## 2020-11-22 DIAGNOSIS — M1611 Unilateral primary osteoarthritis, right hip: Secondary | ICD-10-CM | POA: Diagnosis not present

## 2020-11-23 DIAGNOSIS — M8589 Other specified disorders of bone density and structure, multiple sites: Secondary | ICD-10-CM | POA: Diagnosis not present

## 2020-12-07 DIAGNOSIS — H2513 Age-related nuclear cataract, bilateral: Secondary | ICD-10-CM | POA: Diagnosis not present

## 2021-02-07 DIAGNOSIS — R051 Acute cough: Secondary | ICD-10-CM | POA: Diagnosis not present

## 2021-02-07 DIAGNOSIS — G4733 Obstructive sleep apnea (adult) (pediatric): Secondary | ICD-10-CM | POA: Diagnosis not present

## 2021-02-07 DIAGNOSIS — Z8616 Personal history of COVID-19: Secondary | ICD-10-CM | POA: Diagnosis not present

## 2021-02-07 DIAGNOSIS — J45909 Unspecified asthma, uncomplicated: Secondary | ICD-10-CM | POA: Diagnosis not present

## 2021-02-07 DIAGNOSIS — J302 Other seasonal allergic rhinitis: Secondary | ICD-10-CM | POA: Diagnosis not present

## 2021-02-23 DIAGNOSIS — M1611 Unilateral primary osteoarthritis, right hip: Secondary | ICD-10-CM | POA: Diagnosis not present

## 2021-02-23 DIAGNOSIS — M25551 Pain in right hip: Secondary | ICD-10-CM | POA: Diagnosis not present

## 2021-02-23 DIAGNOSIS — Z6841 Body Mass Index (BMI) 40.0 and over, adult: Secondary | ICD-10-CM | POA: Diagnosis not present

## 2021-03-18 DIAGNOSIS — Z23 Encounter for immunization: Secondary | ICD-10-CM | POA: Diagnosis not present

## 2021-04-01 DIAGNOSIS — E039 Hypothyroidism, unspecified: Secondary | ICD-10-CM | POA: Diagnosis not present

## 2021-04-01 DIAGNOSIS — M1990 Unspecified osteoarthritis, unspecified site: Secondary | ICD-10-CM | POA: Diagnosis not present

## 2021-04-01 DIAGNOSIS — R7301 Impaired fasting glucose: Secondary | ICD-10-CM | POA: Diagnosis not present

## 2021-04-01 DIAGNOSIS — G4733 Obstructive sleep apnea (adult) (pediatric): Secondary | ICD-10-CM | POA: Diagnosis not present

## 2021-04-01 DIAGNOSIS — E785 Hyperlipidemia, unspecified: Secondary | ICD-10-CM | POA: Diagnosis not present

## 2021-04-01 DIAGNOSIS — J302 Other seasonal allergic rhinitis: Secondary | ICD-10-CM | POA: Diagnosis not present

## 2021-04-29 DIAGNOSIS — Z23 Encounter for immunization: Secondary | ICD-10-CM | POA: Diagnosis not present

## 2021-05-05 DIAGNOSIS — M1611 Unilateral primary osteoarthritis, right hip: Secondary | ICD-10-CM | POA: Diagnosis not present

## 2021-06-30 NOTE — Patient Instructions (Addendum)
DUE TO COVID-19 ONLY ONE VISITOR  (aged 70 and older)  IS ALLOWED TO COME WITH YOU AND STAY IN THE WAITING ROOM ONLY DURING PRE OP AND PROCEDURE.   ?**NO VISITORS ARE ALLOWED IN THE SHORT STAY AREA OR RECOVERY ROOM!!** ? ?IF YOU WILL BE ADMITTED INTO THE HOSPITAL YOU ARE ALLOWED ONLY TWO SUPPORT PEOPLE DURING VISITATION HOURS ONLY (7 AM -8PM)   ?The support person(s) must pass our screening, gel in and out, and wear a mask at all times, including in the patient?s room. ?Patients must also wear a mask when staff or their support person are in the room. ?Visitors GUEST BADGE MUST BE WORN VISIBLY  ?One adult visitor may remain with you overnight and MUST be in the room by 8 P.M. ?  ? ? Your procedure is scheduled on: 07/12/21 ? ? Report to [facility] Main Entrance ? ?  Report to admitting at 8:45 AM ? ? Call this number if you have problems the morning of surgery [phone] ? ? Do not eat food :After Midnight. ? ? After Midnight you may have the following liquids until 8:30 AM DAY OF SURGERY ? ?Water ?Black Coffee (sugar ok, NO MILK/CREAM OR CREAMERS)  ?Tea (sugar ok, NO MILK/CREAM OR CREAMERS) regular and decaf                             ?Plain Jell-O (NO RED)                                           ?Fruit ices (not with fruit pulp, NO RED)                                     ?Popsicles (NO RED)                                                                  ?Juice: apple, WHITE grape, WHITE cranberry ?Sports drinks like Gatorade (NO RED) ?Clear broth(vegetable,chicken,beef) ?  ?The day of surgery:  ?Drink ONE (1) Pre-Surgery Clear Ensure at 8:30 AM the morning of surgery. Drink in one sitting. Do not sip.  ?This drink was given to you during your hospital  ?pre-op appointment visit. ?Nothing else to drink after completing the  ?Pre-Surgery Clear Ensure. ?  ?       If you have questions, please contact your surgeon?s office. ? ? ?FOLLOW BOWEL PREP AND ANY ADDITIONAL PRE OP INSTRUCTIONS YOU RECEIVED FROM  YOUR SURGEON'S OFFICE!!! ?  ?  ?Oral Hygiene is also important to reduce your risk of infection.                                    ?Remember - BRUSH YOUR TEETH THE MORNING OF SURGERY WITH YOUR REGULAR TOOTHPASTE ? ? Take these medicines the morning of surgery with A SIP OF WATER: Zyrtec, Levothyroxine, Sertraline, Inhaler.  ? ?Bring CPAP mask and tubing day of surgery. ?                  ?  You may not have any metal on your body including hair pins, jewelry, and body piercing ? ?           Do not wear make-up, lotions, powders, perfumes, or deodorant ? ?Do not wear nail polish including gel and S&S, artificial/acrylic nails, or any other type of covering on natural nails including finger and toenails. If you have artificial nails, gel coating, etc. that needs to be removed by a nail salon please have this removed prior to surgery or surgery may need to be canceled/ delayed if the surgeon/ anesthesia feels like they are unable to be safely monitored.  ? ?Do not shave  48 hours prior to surgery.  ? ? Do not bring valuables to the hospital. [place] NOT ?            RESPONSIBLE   FOR VALUABLES. ? ? Contacts, dentures or bridgework may not be worn into surgery. ? ? Bring small overnight bag day of surgery. ?  ?            Please read over the following fact sheets you were given: IF [place] [phone]- Apolonio Schneiders ? ?   [place] - Preparing for Surgery ?Before surgery, you can play an important role.  Because skin is not sterile, your skin needs to be as free of germs as possible.  You can reduce the number of germs on your skin by washing with CHG (chlorahexidine gluconate) soap before surgery.  CHG is an antiseptic cleaner which kills germs and bonds with the skin to continue killing germs even after washing. ?Please DO NOT use if you have an allergy to CHG or antibacterial soaps.  If your skin becomes reddened/irritated stop using the CHG and  inform your nurse when you arrive at Short Stay. ?Do not shave (including legs and underarms) for at least 48 hours prior to the first CHG shower.  You may shave your face/neck. ? ?Please follow these instructions carefully: ? 1.  Shower with CHG Soap the night before surgery and the  morning of surgery. ? 2.  If you choose to wash your hair, wash your hair first as usual with your normal  shampoo. ? 3.  After you shampoo, rinse your hair and body thoroughly to remove the shampoo.                            ? 4.  Use CHG as you would any other liquid soap.  You can apply chg directly to the skin and wash.  Gently with a scrungie or clean washcloth. ? 5.  Apply the CHG Soap to your body ONLY FROM THE NECK DOWN.   Do   not use on face/ open      ?                     Wound or open sores. Avoid contact with eyes, ears mouth and   genitals (private parts).  ?                     Production manager,  Genitals (private parts) with your normal soap. ?            6.  Wash thoroughly, paying special attention to the area where your    surgery  will be performed. ? 7.  Thoroughly rinse your body with warm water  from the neck down. ? 8.  DO NOT shower/wash with your normal soap after using and rinsing off the CHG Soap. ?               9.  Pat yourself dry with a clean towel. ?           10.  Wear clean pajamas. ?           11.  Place clean sheets on your bed the night of your first shower and do not  sleep with pets. ?Day of Surgery : ?Do not apply any lotions/deodorants the morning of surgery.  Please wear clean clothes to the hospital/surgery center. ? ?FAILURE TO FOLLOW THESE INSTRUCTIONS MAY RESULT IN THE CANCELLATION OF YOUR SURGERY ? ?PATIENT SIGNATURE_________________________________ ? ?NURSE SIGNATURE__________________________________ ? ?________________________________________________________________________  ? ?Incentive Spirometer ? ?An incentive spirometer is a tool that can help keep your lungs clear and active. This tool  measures how well you are filling your lungs with each breath. Taking long deep breaths may help reverse or decrease the chance of developing breathing (pulmonary) problems (especially infection) following: ?A long period of time when you are unable to move or be active. ?BEFORE THE PROCEDURE  ?If the spirometer includes an indicator to show your best effort, your nurse or respiratory therapist will set it to a desired goal. ?If possible, sit up straight or lean slightly forward. Try not to slouch. ?Hold the incentive spirometer in an upright position. ?INSTRUCTIONS FOR USE  ?Sit on the edge of your bed if possible, or sit up as far as you can in bed or on a chair. ?Hold the incentive spirometer in an upright position. ?Breathe out normally. ?Place the mouthpiece in your mouth and seal your lips tightly around it. ?Breathe in slowly and as deeply as possible, raising the piston or the ball toward the top of the column. ?Hold your breath for 3-5 seconds or for as long as possible. Allow the piston or ball to fall to the bottom of the column. ?Remove the mouthpiece from your mouth and breathe out normally. ?Rest for a few seconds and repeat Steps 1 through 7 at least 10 times every 1-2 hours when you are awake. Take your time and take a few normal breaths between deep breaths. ?The spirometer may include an indicator to show your best effort. Use the indicator as a goal to work toward during each repetition. ?After each set of 10 deep breaths, practice coughing to be sure your lungs are clear. If you have an incision (the cut made at the time of surgery), support your incision when coughing by placing a pillow or rolled up towels firmly against it. ?Once you are able to get out of bed, walk around indoors and cough well. You may stop using the incentive spirometer when instructed by your caregiver.  ?RISKS AND COMPLICATIONS ?Take your time so you do not get dizzy or light-headed. ?If you are in pain, you may need to  take or ask for pain medication before doing incentive spirometry. It is harder to take a deep breath if you are having pain. ?AFTER USE ?Rest and breathe slowly and easily. ?It can be helpful to keep tra

## 2021-06-30 NOTE — Progress Notes (Addendum)
COVID Vaccine Completed: yes x2 ?Date COVID Vaccine completed: ?Has received booster: ?COVID vaccine manufacturer: [place]  ? ?Date of COVID positive in last 90 days: no ? ?PCP - Crist Infante, MD ?Cardiologist - n/a ? ?Chest x-ray - n/a ?EKG - n/a ?Stress Test - n/a ?ECHO - n/a ?Cardiac Cath - n/a ?Pacemaker/ICD device last checked: n/a ?Spinal Cord Stimulator: n/a ? ?Bowel Prep - no ? ?Sleep Study - yes, positive ?CPAP - yes every night ? ?Fasting Blood Sugar - n/a ?Checks Blood Sugar _____ times a day ? ?Blood Thinner Instructions: n/a ?Aspirin Instructions: ?Last Dose: ? ?Activity level: Can perform activities of daily living without stopping and without symptoms of chest pain. Difficulty with steps due to hip. SOB with exertion   ? ?Anesthesia review:  ? ?Patient denies shortness of breath, fever, cough and chest pain at PAT appointment ? ? ?Patient verbalized understanding of instructions that were given to them at the PAT appointment. Patient was also instructed that they will need to review over the PAT instructions again at home before surgery.  ?

## 2021-07-01 ENCOUNTER — Encounter (HOSPITAL_COMMUNITY)
Admission: RE | Admit: 2021-07-01 | Discharge: 2021-07-01 | Disposition: A | Discharge status: Home / Self Care | Payer: Medicare Other | Source: Ambulatory Visit | Attending: Orthopedic Surgery | Admitting: Orthopedic Surgery

## 2021-07-01 ENCOUNTER — Other Ambulatory Visit: Payer: Self-pay

## 2021-07-01 ENCOUNTER — Encounter (HOSPITAL_COMMUNITY): Payer: Self-pay

## 2021-07-01 VITALS — BP 130/85 | HR 82 | Temp 97.6°F | Resp 18 | Ht 64.0 in | Wt 226.0 lb

## 2021-07-01 DIAGNOSIS — M1611 Unilateral primary osteoarthritis, right hip: Secondary | ICD-10-CM | POA: Insufficient documentation

## 2021-07-01 DIAGNOSIS — Z01812 Encounter for preprocedural laboratory examination: Secondary | ICD-10-CM | POA: Diagnosis not present

## 2021-07-01 DIAGNOSIS — Z01818 Encounter for other preprocedural examination: Secondary | ICD-10-CM

## 2021-07-01 DIAGNOSIS — M25562 Pain in left knee: Secondary | ICD-10-CM | POA: Diagnosis not present

## 2021-07-01 DIAGNOSIS — M25561 Pain in right knee: Secondary | ICD-10-CM | POA: Diagnosis not present

## 2021-07-01 HISTORY — DX: Toxic shock syndrome: A48.3

## 2021-07-01 HISTORY — DX: Unspecified osteoarthritis, unspecified site: M19.90

## 2021-07-01 HISTORY — DX: Pneumonia, unspecified organism: J18.9

## 2021-07-01 HISTORY — DX: Sleep apnea, unspecified: G47.30

## 2021-07-01 HISTORY — DX: Gastro-esophageal reflux disease without esophagitis: K21.9

## 2021-07-01 LAB — COMPREHENSIVE METABOLIC PANEL
ALT: 19 U/L (ref 0–44)
AST: 22 U/L (ref 15–41)
Albumin: 4.1 g/dL (ref 3.5–5.0)
Alkaline Phosphatase: 106 U/L (ref 38–126)
Anion gap: 8 (ref 5–15)
BUN: 23 mg/dL (ref 8–23)
CO2: 25 mmol/L (ref 22–32)
Calcium: 9.3 mg/dL (ref 8.9–10.3)
Chloride: 101 mmol/L (ref 98–111)
Creatinine, Ser: 0.77 mg/dL (ref 0.44–1.00)
GFR, Estimated: >60 mL/min (ref >60)
Glucose, Bld: 91 mg/dL (ref 70–99)
Potassium: 4.5 mmol/L (ref 3.5–5.1)
Sodium: 134 mmol/L — ABNORMAL LOW (ref 135–145)
Total Bilirubin: 0.5 mg/dL (ref 0.3–1.2)
Total Protein: 7.2 g/dL (ref 6.5–8.1)

## 2021-07-01 LAB — CBC
HCT: 41.1 % (ref 36.0–46.0)
Hemoglobin: 13.4 g/dL (ref 12.0–15.0)
MCH: 28.9 pg (ref 26.0–34.0)
MCHC: 32.6 g/dL (ref 30.0–36.0)
MCV: 88.8 fL (ref 80.0–100.0)
Platelets: 231 10*3/uL (ref 150–400)
RBC: 4.63 MIL/uL (ref 3.87–5.11)
RDW: 13.9 % (ref 11.5–15.5)
WBC: 4.9 10*3/uL (ref 4.0–10.5)
nRBC: 0 % (ref 0.0–0.2)

## 2021-07-01 LAB — TYPE AND SCREEN
ABO/RH(D): O POS
Antibody Screen: NEGATIVE

## 2021-07-01 LAB — SURGICAL PCR SCREEN
MRSA, PCR: NEGATIVE
Staphylococcus aureus: NEGATIVE

## 2021-07-05 NOTE — Progress Notes (Signed)
CBC is a medically necessary test prior to undergoing joint replacement surgery. It is pertinent to know pre-operative hemoglobin for surgical risk assessment and planning. ? ?

## 2021-07-07 NOTE — H&P (Signed)
TOTAL HIP ADMISSION H&P ? ?Patient is admitted for right total hip arthroplasty. ? ?Subjective: ? ?Chief Complaint: right hip pain ? ?HPI: Belinda [name], 71 y.o. female, has a history of pain and functional disability in the right hip(s) due to arthritis and patient has failed non-surgical conservative treatments for greater than 12 weeks to include NSAID's and/or analgesics and activity modification.  Onset of symptoms was gradual starting  several  years ago with gradually worsening course since that time.The patient noted no past surgery on the right hip(s).  Patient currently rates pain in the right hip at 8 out of 10 with activity. Patient has worsening of pain with activity and weight bearing, pain that interfers with activities of daily living, and pain with passive range of motion. Patient has evidence of joint space narrowing by imaging studies. This condition presents safety issues increasing the risk of falls.   There is no current active infection. ? ?Patient Active Problem List  ? Diagnosis Date Noted  ? Other intraarticular fracture of lower end of left radius, initial encounter for closed fracture 11/02/2015  ? Open fracture of distal end of left radius with routine healing 11/02/2015  ? Pain 11/02/2015  ? Stiffness of left wrist joint 11/02/2015  ? ?Past Medical History:  ?Diagnosis Date  ? Anxiety   ? Arthritis   ? Asthma   ? Candidiasis   ? Cataracts, bilateral   ? CPAP (continuous positive airway pressure) dependence   ? Depression   ? Distal radius fracture, left   ? Dyslipidemia   ? GERD (gastroesophageal reflux disease)   ? Heart murmur   ? born with a functional murmur  ? Hematuria   ? Hypothyroidism   ? IBS (irritable bowel syndrome)   ? Osteopenia   ? Pneumonia   ? Seasonal allergies   ? Sleep apnea   ? Stress incontinence   ? 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  ?Past Surgical History:  ?Procedure Laterality Date  ? CARPAL TUNNEL RELEASE Left 10/27/2015  ? Procedure: LEFT CARPAL TUNNEL  RELEASE;  Surgeon: Charlotte Crumb, MD;  Location: [facility];  Service: Orthopedics;  Laterality: Left;  ? CATARACT EXTRACTION    ? CATARACT EXTRACTION  2017  ? x 2  ? COLONOSCOPY    ? JOINT REPLACEMENT Bilateral   ? OPEN REDUCTION INTERNAL FIXATION (ORIF) DISTAL RADIAL FRACTURE Left 10/27/2015  ? Procedure: OPEN REDUCTION INTERNAL FIXATION (ORIF) LEFT  DISTAL RADIAL FRACTURE;  Surgeon: Charlotte Crumb, MD;  Location: [facility];  Service: Orthopedics;  Laterality: Left;  ? TONSILLECTOMY    ? TOTAL KNEE ARTHROPLASTY    ? x 2  ?  ?No current facility-administered medications for this encounter.  ? ?Current Outpatient Medications  ?Medication Sig Dispense Refill Last Dose  ? ASMANEX 60 METERED DOSES 220 MCG/INH inhaler Inhale 2 puffs into the lungs daily as needed.      ? Camphor-Menthol-Methyl Sal (SALONPAS EX) Apply 1 application. topically daily as needed (pain).     ? cetirizine (ZYRTEC) 10 MG tablet Take 10 mg by mouth daily.     ? Cholecalciferol (VITAMIN D3) 125 MCG (5000 UT) CAPS Take 5,000 Units by mouth once a week.     ? clindamycin (CLEOCIN) 150 MG capsule Take 600 mg by mouth once. For dental appt.     ? diclofenac (VOLTAREN) 75 MG EC tablet Take 75 mg by mouth daily.     ? fluticasone (FLONASE) 50 MCG/ACT nasal spray  Place 1 spray into both nostrils daily.     ? ibuprofen (ADVIL) 200 MG tablet Take 200 mg by mouth daily as needed for mild pain.     ? levothyroxine (SYNTHROID) 200 MCG tablet Take 200 mcg by mouth daily.     ? Lidocaine-Menthol (ICY HOT MAX LIDOCAINE EX) Apply 1 application. topically daily as needed (pain).     ? Menthol, Topical Analgesic, (BIOFREEZE EX) Apply 1 application. topically daily as needed (pain).     ? montelukast (SINGULAIR) 10 MG tablet Take 10 mg by mouth daily.     ? NYSTATIN powder Apply 1 application. topically 2 (two) times daily as needed (yeast infection).  2   ? sertraline (ZOLOFT) 100 MG tablet Take 100 mg by mouth daily.     ?  VENTOLIN HFA 108 (90 Base) MCG/ACT inhaler Inhale 1-2 puffs into the lungs every 4 (four) hours as needed.   5   ? vitamin C (ASCORBIC ACID) 500 MG tablet Take 500 mg by mouth daily.     ? Zinc 50 MG TABS Take 50 mg by mouth daily.     ? ?Allergies  ?Allergen Reactions  ? Penicillins Rash  ?  ?Social History  ? ?Tobacco Use  ? Smoking status: Never  ? Smokeless tobacco: Never  ?Substance Use Topics  ? Alcohol use: Yes  ?  Comment: rarely   ?  ?Family History  ?Problem Relation Age of Onset  ? Kidney cancer Father   ? Colon cancer Maternal Grandmother   ? Lung cancer Maternal Grandfather   ? Heart attack Maternal Grandfather   ?  ? ?Review of Systems  ?Constitutional:  Negative for chills and fever.  ?Respiratory:  Negative for cough and shortness of breath.   ?Cardiovascular:  Negative for chest pain.  ?Gastrointestinal:  Negative for nausea and vomiting.  ?Musculoskeletal:  Positive for arthralgias.  ? ? ?Objective: ? ?Physical Exam ?Well nourished and well developed. ?General: Alert and oriented x3, cooperative and pleasant, no acute distress. ?Head: normocephalic, atraumatic, neck supple. ?Eyes: EOMI. ? ?Musculoskeletal: ?Right hip exam: ?Today I deferred further evaluation right hip based on the radiographic findings of advanced hip osteoarthritis with no joint space. ?No lower extremity edema or erythema ? ?Calves soft and nontender. Motor function intact in LE. Strength 5/5 LE bilaterally. ?Neuro: Distal pulses 2+. Sensation to light touch intact in LE. ? ? ?Vital signs in last 24 hours: ?  ? ?Labs: ? ? ?Estimated body mass index is 38.79 kg/m? as calculated from the following: ?  Height as of 07/01/21: '5\' 4"'$  (1.626 m). ?  Weight as of 07/01/21: 102.5 kg. ? ? ?Imaging Review ?Plain radiographs demonstrate severe degenerative joint disease of the right hip(s). The bone quality appears to be adequate for age and reported activity level. ? ? ? ? ? ?Assessment/Plan: ? ?End stage arthritis, right hip(s) ? ?The  patient history, physical examination, clinical judgement of the provider and imaging studies are consistent with end stage degenerative joint disease of the right hip(s) and total hip arthroplasty is deemed medically necessary. The treatment options including medical management, injection therapy, arthroscopy and arthroplasty were discussed at length. The risks and benefits of total hip arthroplasty were presented and reviewed. The risks due to aseptic loosening, infection, stiffness, dislocation/subluxation,  thromboembolic complications and other imponderables were discussed.  The patient acknowledged the explanation, agreed to proceed with the plan and consent was signed. Patient is being admitted for inpatient treatment for surgery, pain control, PT,  OT, prophylactic antibiotics, VTE prophylaxis, progressive ambulation and ADL's and discharge planning.The patient is planning to be discharged  home. ? ?Therapy Plans: HEP ?Disposition: Home with sister & daughter ?Planned DVT Prophylaxis: aspirin '81mg'$  BID ?DME needed: none ?PCP: Dr. Crist Infante, ?TXA: IV ?Allergies: PCN - hives (okay with keflex) ?Anesthesia Concerns: none ?BMI: 38 ?Last HgbA1c: Not diabetic ? ? ?Other: ?- Hydrocodone, robaxin (recent elevation in LFTs vs Cr. due to diclofenac? - will check labs) ?- Having bilateral knee pain, likely referred/based on gait - hx of TKAs ? ?Costella Hatcher, PA-C ?Orthopedic Surgery ?[facility] ?[phone] ? ?

## 2021-07-12 ENCOUNTER — Ambulatory Visit (HOSPITAL_COMMUNITY): Payer: Medicare Other

## 2021-07-12 ENCOUNTER — Encounter (HOSPITAL_COMMUNITY)
Admission: AD | Disposition: A | Discharge status: Home / Self Care | Payer: Self-pay | Source: Home / Self Care | Attending: Orthopedic Surgery

## 2021-07-12 ENCOUNTER — Encounter (HOSPITAL_COMMUNITY): Payer: Self-pay | Admitting: Orthopedic Surgery

## 2021-07-12 ENCOUNTER — Ambulatory Visit (HOSPITAL_COMMUNITY): Payer: Medicare Other | Admitting: Anesthesiology

## 2021-07-12 ENCOUNTER — Inpatient Hospital Stay (HOSPITAL_COMMUNITY)
Admission: AD | Admit: 2021-07-12 | Discharge: 2021-07-14 | DRG: 470 | Disposition: A | Discharge status: Home / Self Care | Payer: Medicare Other | Attending: Orthopedic Surgery | Admitting: Orthopedic Surgery

## 2021-07-12 ENCOUNTER — Observation Stay (HOSPITAL_COMMUNITY): Payer: Medicare Other

## 2021-07-12 ENCOUNTER — Other Ambulatory Visit: Payer: Self-pay

## 2021-07-12 DIAGNOSIS — M24151 Other articular cartilage disorders, right hip: Secondary | ICD-10-CM | POA: Diagnosis not present

## 2021-07-12 DIAGNOSIS — Z7951 Long term (current) use of inhaled steroids: Secondary | ICD-10-CM | POA: Diagnosis not present

## 2021-07-12 DIAGNOSIS — E785 Hyperlipidemia, unspecified: Secondary | ICD-10-CM | POA: Diagnosis present

## 2021-07-12 DIAGNOSIS — Z880 Allergy status to penicillin: Secondary | ICD-10-CM

## 2021-07-12 DIAGNOSIS — Z79890 Hormone replacement therapy (postmenopausal): Secondary | ICD-10-CM | POA: Diagnosis not present

## 2021-07-12 DIAGNOSIS — Z79899 Other long term (current) drug therapy: Secondary | ICD-10-CM | POA: Diagnosis not present

## 2021-07-12 DIAGNOSIS — Z471 Aftercare following joint replacement surgery: Secondary | ICD-10-CM | POA: Diagnosis not present

## 2021-07-12 DIAGNOSIS — F32A Depression, unspecified: Secondary | ICD-10-CM | POA: Diagnosis present

## 2021-07-12 DIAGNOSIS — Z800 Family history of malignant neoplasm of digestive organs: Secondary | ICD-10-CM

## 2021-07-12 DIAGNOSIS — K219 Gastro-esophageal reflux disease without esophagitis: Secondary | ICD-10-CM | POA: Diagnosis present

## 2021-07-12 DIAGNOSIS — Z8051 Family history of malignant neoplasm of kidney: Secondary | ICD-10-CM

## 2021-07-12 DIAGNOSIS — M1611 Unilateral primary osteoarthritis, right hip: Principal | ICD-10-CM | POA: Diagnosis present

## 2021-07-12 DIAGNOSIS — M8580 Other specified disorders of bone density and structure, unspecified site: Secondary | ICD-10-CM | POA: Diagnosis present

## 2021-07-12 DIAGNOSIS — Z96653 Presence of artificial knee joint, bilateral: Secondary | ICD-10-CM | POA: Diagnosis present

## 2021-07-12 DIAGNOSIS — J45909 Unspecified asthma, uncomplicated: Secondary | ICD-10-CM | POA: Diagnosis present

## 2021-07-12 DIAGNOSIS — Z96641 Presence of right artificial hip joint: Secondary | ICD-10-CM | POA: Diagnosis not present

## 2021-07-12 DIAGNOSIS — Z801 Family history of malignant neoplasm of trachea, bronchus and lung: Secondary | ICD-10-CM | POA: Diagnosis not present

## 2021-07-12 DIAGNOSIS — F419 Anxiety disorder, unspecified: Secondary | ICD-10-CM | POA: Diagnosis present

## 2021-07-12 DIAGNOSIS — E039 Hypothyroidism, unspecified: Secondary | ICD-10-CM | POA: Diagnosis present

## 2021-07-12 DIAGNOSIS — Z8249 Family history of ischemic heart disease and other diseases of the circulatory system: Secondary | ICD-10-CM | POA: Diagnosis not present

## 2021-07-12 DIAGNOSIS — M25761 Osteophyte, right knee: Secondary | ICD-10-CM | POA: Diagnosis not present

## 2021-07-12 DIAGNOSIS — G4730 Sleep apnea, unspecified: Secondary | ICD-10-CM | POA: Diagnosis present

## 2021-07-12 HISTORY — PX: TOTAL HIP ARTHROPLASTY: SHX124

## 2021-07-12 LAB — ABO/RH: ABO/RH(D): O POS

## 2021-07-12 SURGERY — ARTHROPLASTY, HIP, TOTAL, ANTERIOR APPROACH
Anesthesia: General | Site: Hip | Laterality: Right

## 2021-07-12 MED ORDER — PROPOFOL 10 MG/ML IV BOLUS
INTRAVENOUS | Status: AC
  Filled 2021-07-12: qty 20

## 2021-07-12 MED ORDER — ALBUTEROL SULFATE (2.5 MG/3ML) 0.083% IN NEBU: 3.0000 mL | INHALATION_SOLUTION | RESPIRATORY_TRACT | Status: DC | PRN

## 2021-07-12 MED ORDER — DEXAMETHASONE SODIUM PHOSPHATE 10 MG/ML IJ SOLN
INTRAMUSCULAR | Status: AC
  Filled 2021-07-12: qty 1

## 2021-07-12 MED ORDER — POVIDONE-IODINE 10 % EX SWAB
2.0000 "application " | Freq: Once | CUTANEOUS | Status: AC
  Administered 2021-07-12: 2 via TOPICAL

## 2021-07-12 MED ORDER — BUDESONIDE 0.5 MG/2ML IN SUSP
0.5000 mg | Freq: Two times a day (BID) | RESPIRATORY_TRACT | Status: DC
  Administered 2021-07-13 – 2021-07-14 (×3): 0.5 mg via RESPIRATORY_TRACT
  Filled 2021-07-12 (×4): qty 2

## 2021-07-12 MED ORDER — TRANEXAMIC ACID-NACL 1000-0.7 MG/100ML-% IV SOLN
1000.0000 mg | Freq: Once | INTRAVENOUS | Status: AC
  Administered 2021-07-12: 1000 mg via INTRAVENOUS
  Filled 2021-07-12: qty 100

## 2021-07-12 MED ORDER — ONDANSETRON HCL 4 MG/2ML IJ SOLN: 4.0000 mg | Freq: Four times a day (QID) | INTRAMUSCULAR | Status: DC | PRN

## 2021-07-12 MED ORDER — PHENYLEPHRINE HCL (PRESSORS) 10 MG/ML IV SOLN
INTRAVENOUS | Status: AC
  Filled 2021-07-12: qty 1

## 2021-07-12 MED ORDER — POLYETHYLENE GLYCOL 3350 17 G PO PACK: 17.0000 g | PACK | Freq: Every day | ORAL | Status: DC | PRN

## 2021-07-12 MED ORDER — DEXAMETHASONE SODIUM PHOSPHATE 10 MG/ML IJ SOLN
10.0000 mg | Freq: Once | INTRAMUSCULAR | Status: AC
  Administered 2021-07-13: 10 mg via INTRAVENOUS
  Filled 2021-07-12: qty 1

## 2021-07-12 MED ORDER — ONDANSETRON HCL 4 MG/2ML IJ SOLN
INTRAMUSCULAR | Status: DC | PRN
  Administered 2021-07-12: 4 mg via INTRAVENOUS

## 2021-07-12 MED ORDER — ONDANSETRON HCL 4 MG/2ML IJ SOLN: 4.0000 mg | Freq: Once | INTRAMUSCULAR | Status: DC | PRN

## 2021-07-12 MED ORDER — ONDANSETRON HCL 4 MG/2ML IJ SOLN
INTRAMUSCULAR | Status: AC
  Filled 2021-07-12: qty 2

## 2021-07-12 MED ORDER — TRANEXAMIC ACID-NACL 1000-0.7 MG/100ML-% IV SOLN
1000.0000 mg | INTRAVENOUS | Status: AC
  Administered 2021-07-12: 1000 mg via INTRAVENOUS
  Filled 2021-07-12: qty 100

## 2021-07-12 MED ORDER — HYDROCODONE-ACETAMINOPHEN 7.5-325 MG PO TABS: 1.0000 | ORAL_TABLET | ORAL | Status: DC | PRN

## 2021-07-12 MED ORDER — SODIUM CHLORIDE 0.9 % IV SOLN: INTRAVENOUS | Status: DC

## 2021-07-12 MED ORDER — HYDROMORPHONE HCL 1 MG/ML IJ SOLN
0.2500 mg | INTRAMUSCULAR | Status: DC | PRN
  Administered 2021-07-12: 0.5 mg via INTRAVENOUS

## 2021-07-12 MED ORDER — ASPIRIN 81 MG PO CHEW
81.0000 mg | CHEWABLE_TABLET | Freq: Two times a day (BID) | ORAL | Status: DC
  Administered 2021-07-12 – 2021-07-14 (×4): 81 mg via ORAL
  Filled 2021-07-12 (×4): qty 1

## 2021-07-12 MED ORDER — PHENOL 1.4 % MT LIQD: 1.0000 | OROMUCOSAL | Status: DC | PRN

## 2021-07-12 MED ORDER — METHOCARBAMOL 500 MG IVPB - SIMPLE MED
500.0000 mg | Freq: Four times a day (QID) | INTRAVENOUS | Status: DC | PRN
  Filled 2021-07-12: qty 50

## 2021-07-12 MED ORDER — CHLORHEXIDINE GLUCONATE 0.12 % MT SOLN: 15.0000 mL | Freq: Once | OROMUCOSAL | Status: AC

## 2021-07-12 MED ORDER — METHOCARBAMOL 500 MG IVPB - SIMPLE MED
INTRAVENOUS | Status: AC
  Administered 2021-07-12: 500 mg
  Filled 2021-07-12: qty 50

## 2021-07-12 MED ORDER — LIDOCAINE 2% (20 MG/ML) 5 ML SYRINGE
INTRAMUSCULAR | Status: DC | PRN
  Administered 2021-07-12: 100 mg via INTRAVENOUS

## 2021-07-12 MED ORDER — SODIUM CHLORIDE (PF) 0.9 % IJ SOLN
INTRAMUSCULAR | Status: AC
  Filled 2021-07-12: qty 10

## 2021-07-12 MED ORDER — MIDAZOLAM HCL 2 MG/2ML IJ SOLN
INTRAMUSCULAR | Status: AC
  Filled 2021-07-12: qty 2

## 2021-07-12 MED ORDER — ALBUMIN HUMAN 5 % IV SOLN: INTRAVENOUS | Status: DC | PRN

## 2021-07-12 MED ORDER — SODIUM CHLORIDE 0.9 % IR SOLN
Status: DC | PRN
  Administered 2021-07-12: 1000 mL

## 2021-07-12 MED ORDER — FENTANYL CITRATE (PF) 100 MCG/2ML IJ SOLN
INTRAMUSCULAR | Status: AC
  Filled 2021-07-12: qty 2

## 2021-07-12 MED ORDER — HYDROMORPHONE HCL 2 MG/ML IJ SOLN
INTRAMUSCULAR | Status: AC
  Filled 2021-07-12: qty 1

## 2021-07-12 MED ORDER — HYDROCODONE-ACETAMINOPHEN 5-325 MG PO TABS
1.0000 | ORAL_TABLET | ORAL | Status: DC | PRN
  Administered 2021-07-12 – 2021-07-14 (×6): 2 via ORAL
  Filled 2021-07-12 (×6): qty 2

## 2021-07-12 MED ORDER — MENTHOL 3 MG MT LOZG: 1.0000 | LOZENGE | OROMUCOSAL | Status: DC | PRN

## 2021-07-12 MED ORDER — SUCCINYLCHOLINE CHLORIDE 200 MG/10ML IV SOSY
PREFILLED_SYRINGE | INTRAVENOUS | Status: DC | PRN
  Administered 2021-07-12: 140 mg via INTRAVENOUS

## 2021-07-12 MED ORDER — ROCURONIUM BROMIDE 10 MG/ML (PF) SYRINGE
PREFILLED_SYRINGE | INTRAVENOUS | Status: AC
  Filled 2021-07-12: qty 10

## 2021-07-12 MED ORDER — STERILE WATER FOR IRRIGATION IR SOLN
Status: DC | PRN
  Administered 2021-07-12: 2000 mL

## 2021-07-12 MED ORDER — SUGAMMADEX SODIUM 500 MG/5ML IV SOLN
INTRAVENOUS | Status: DC | PRN
  Administered 2021-07-12: 230 mg via INTRAVENOUS

## 2021-07-12 MED ORDER — LACTATED RINGERS IV SOLN: INTRAVENOUS | Status: DC

## 2021-07-12 MED ORDER — MORPHINE SULFATE (PF) 2 MG/ML IV SOLN: 0.5000 mg | INTRAVENOUS | Status: DC | PRN

## 2021-07-12 MED ORDER — HYDROMORPHONE HCL 1 MG/ML IJ SOLN
INTRAMUSCULAR | Status: AC
  Administered 2021-07-12: 0.5 mg via INTRAVENOUS
  Filled 2021-07-12: qty 2

## 2021-07-12 MED ORDER — ACETAMINOPHEN 325 MG PO TABS: 325.0000 mg | ORAL_TABLET | Freq: Four times a day (QID) | ORAL | Status: DC | PRN

## 2021-07-12 MED ORDER — FLUTICASONE PROPIONATE 50 MCG/ACT NA SUSP
1.0000 | Freq: Every day | NASAL | Status: DC
  Administered 2021-07-12 – 2021-07-14 (×3): 1 via NASAL
  Filled 2021-07-12: qty 16

## 2021-07-12 MED ORDER — DEXAMETHASONE SODIUM PHOSPHATE 10 MG/ML IJ SOLN
INTRAMUSCULAR | Status: DC | PRN
  Administered 2021-07-12: 10 mg via INTRAVENOUS

## 2021-07-12 MED ORDER — DIPHENHYDRAMINE HCL 12.5 MG/5ML PO ELIX: 12.5000 mg | ORAL_SOLUTION | ORAL | Status: DC | PRN

## 2021-07-12 MED ORDER — METHOCARBAMOL 500 MG PO TABS
500.0000 mg | ORAL_TABLET | Freq: Four times a day (QID) | ORAL | Status: DC | PRN
  Administered 2021-07-12 – 2021-07-13 (×2): 500 mg via ORAL
  Filled 2021-07-12 (×2): qty 1

## 2021-07-12 MED ORDER — METOCLOPRAMIDE HCL 5 MG PO TABS: 5.0000 mg | ORAL_TABLET | Freq: Three times a day (TID) | ORAL | Status: DC | PRN

## 2021-07-12 MED ORDER — CEFAZOLIN SODIUM-DEXTROSE 2-4 GM/100ML-% IV SOLN
2.0000 g | INTRAVENOUS | Status: AC
  Administered 2021-07-12: 2 g via INTRAVENOUS
  Filled 2021-07-12: qty 100

## 2021-07-12 MED ORDER — ALBUMIN HUMAN 5 % IV SOLN
INTRAVENOUS | Status: AC
  Filled 2021-07-12: qty 250

## 2021-07-12 MED ORDER — ORAL CARE MOUTH RINSE
15.0000 mL | Freq: Once | OROMUCOSAL | Status: AC
  Administered 2021-07-12: 15 mL via OROMUCOSAL

## 2021-07-12 MED ORDER — HYDROMORPHONE HCL 1 MG/ML IJ SOLN
INTRAMUSCULAR | Status: DC | PRN
  Administered 2021-07-12 (×3): .4 mg via INTRAVENOUS
  Administered 2021-07-12: .8 mg via INTRAVENOUS

## 2021-07-12 MED ORDER — MIDAZOLAM HCL 5 MG/5ML IJ SOLN
INTRAMUSCULAR | Status: DC | PRN
  Administered 2021-07-12: 2 mg via INTRAVENOUS

## 2021-07-12 MED ORDER — BISACODYL 10 MG RE SUPP: 10.0000 mg | Freq: Every day | RECTAL | Status: DC | PRN

## 2021-07-12 MED ORDER — MONTELUKAST SODIUM 10 MG PO TABS
10.0000 mg | ORAL_TABLET | Freq: Every day | ORAL | Status: DC
  Administered 2021-07-13 – 2021-07-14 (×2): 10 mg via ORAL
  Filled 2021-07-12 (×2): qty 1

## 2021-07-12 MED ORDER — SUCCINYLCHOLINE CHLORIDE 200 MG/10ML IV SOSY
PREFILLED_SYRINGE | INTRAVENOUS | Status: AC
  Filled 2021-07-12: qty 10

## 2021-07-12 MED ORDER — ROCURONIUM BROMIDE 100 MG/10ML IV SOLN
INTRAVENOUS | Status: DC | PRN
  Administered 2021-07-12: 30 mg via INTRAVENOUS
  Administered 2021-07-12 (×2): 10 mg via INTRAVENOUS

## 2021-07-12 MED ORDER — ONDANSETRON HCL 4 MG PO TABS: 4.0000 mg | ORAL_TABLET | Freq: Four times a day (QID) | ORAL | Status: DC | PRN

## 2021-07-12 MED ORDER — CEFAZOLIN SODIUM-DEXTROSE 2-4 GM/100ML-% IV SOLN
2.0000 g | Freq: Four times a day (QID) | INTRAVENOUS | Status: AC
  Administered 2021-07-12 (×2): 2 g via INTRAVENOUS
  Filled 2021-07-12 (×2): qty 100

## 2021-07-12 MED ORDER — DEXAMETHASONE SODIUM PHOSPHATE 10 MG/ML IJ SOLN: 8.0000 mg | Freq: Once | INTRAMUSCULAR | Status: DC

## 2021-07-12 MED ORDER — PROPOFOL 500 MG/50ML IV EMUL
INTRAVENOUS | Status: AC
  Filled 2021-07-12: qty 50

## 2021-07-12 MED ORDER — LIDOCAINE HCL (PF) 2 % IJ SOLN
INTRAMUSCULAR | Status: AC
  Filled 2021-07-12: qty 5

## 2021-07-12 MED ORDER — METOCLOPRAMIDE HCL 5 MG/ML IJ SOLN: 5.0000 mg | Freq: Three times a day (TID) | INTRAMUSCULAR | Status: DC | PRN

## 2021-07-12 MED ORDER — FENTANYL CITRATE (PF) 100 MCG/2ML IJ SOLN
INTRAMUSCULAR | Status: DC | PRN
  Administered 2021-07-12 (×2): 50 ug via INTRAVENOUS
  Administered 2021-07-12: 100 ug via INTRAVENOUS

## 2021-07-12 MED ORDER — PROPOFOL 10 MG/ML IV BOLUS
INTRAVENOUS | Status: DC | PRN
  Administered 2021-07-12: 130 mg via INTRAVENOUS
  Administered 2021-07-12: 20 mg via INTRAVENOUS

## 2021-07-12 MED ORDER — FERROUS SULFATE 325 (65 FE) MG PO TABS
325.0000 mg | ORAL_TABLET | Freq: Three times a day (TID) | ORAL | Status: DC
  Administered 2021-07-12 – 2021-07-14 (×5): 325 mg via ORAL
  Filled 2021-07-12 (×5): qty 1

## 2021-07-12 MED ORDER — LEVOTHYROXINE SODIUM 100 MCG PO TABS
200.0000 ug | ORAL_TABLET | Freq: Every day | ORAL | Status: DC
  Administered 2021-07-13 – 2021-07-14 (×2): 200 ug via ORAL
  Filled 2021-07-12 (×2): qty 2

## 2021-07-12 MED ORDER — LORATADINE 10 MG PO TABS
10.0000 mg | ORAL_TABLET | Freq: Every day | ORAL | Status: DC
  Administered 2021-07-13 – 2021-07-14 (×2): 10 mg via ORAL
  Filled 2021-07-12 (×2): qty 1

## 2021-07-12 MED ORDER — DOCUSATE SODIUM 100 MG PO CAPS
100.0000 mg | ORAL_CAPSULE | Freq: Two times a day (BID) | ORAL | Status: DC
  Administered 2021-07-12 – 2021-07-14 (×4): 100 mg via ORAL
  Filled 2021-07-12 (×4): qty 1

## 2021-07-12 MED ORDER — SERTRALINE HCL 100 MG PO TABS
100.0000 mg | ORAL_TABLET | Freq: Every day | ORAL | Status: DC
  Administered 2021-07-13 – 2021-07-14 (×2): 100 mg via ORAL
  Filled 2021-07-12 (×2): qty 1

## 2021-07-12 SURGICAL SUPPLY — 47 items
ADH SKN CLS APL DERMABOND .7 (GAUZE/BANDAGES/DRESSINGS) ×1
BAG COUNTER SPONGE SURGICOUNT (BAG) ×1 IMPLANT
BAG DECANTER FOR FLEXI CONT (MISCELLANEOUS) IMPLANT
BAG SPEC THK2 15X12 ZIP CLS (MISCELLANEOUS) ×1
BAG SPNG CNTER NS LX DISP (BAG) ×1
BAG ZIPLOCK 12X15 (MISCELLANEOUS) ×1 IMPLANT
BLADE SAG 18X100X1.27 (BLADE) ×2 IMPLANT
COVER PERINEAL POST (MISCELLANEOUS) ×2 IMPLANT
COVER SURGICAL LIGHT HANDLE (MISCELLANEOUS) ×2 IMPLANT
CUP ACET PINNACLE SECTR 58MM (Hips) IMPLANT
DERMABOND ADVANCED (GAUZE/BANDAGES/DRESSINGS) ×1
DERMABOND ADVANCED .7 DNX12 (GAUZE/BANDAGES/DRESSINGS) ×1 IMPLANT
DRAPE FOOT SWITCH (DRAPES) ×2 IMPLANT
DRAPE STERI IOBAN 125X83 (DRAPES) ×2 IMPLANT
DRAPE U-SHAPE 47X51 STRL (DRAPES) ×4 IMPLANT
DRESSING AQUACEL AG SP 3.5X10 (GAUZE/BANDAGES/DRESSINGS) ×1 IMPLANT
DRSG AQUACEL AG ADV 3.5X10 (GAUZE/BANDAGES/DRESSINGS) ×1 IMPLANT
DRSG AQUACEL AG SP 3.5X10 (GAUZE/BANDAGES/DRESSINGS) ×2
DRSG TEGADERM 4X4.75 (GAUZE/BANDAGES/DRESSINGS) ×1 IMPLANT
DRSG TEGADERM 8X12 (GAUZE/BANDAGES/DRESSINGS) ×1 IMPLANT
DURAPREP 26ML APPLICATOR (WOUND CARE) ×2 IMPLANT
ELECT REM PT RETURN 15FT ADLT (MISCELLANEOUS) ×2 IMPLANT
ELIMINATOR HOLE APEX DEPUY (Hips) ×1 IMPLANT
GLOVE SURG ENC MOIS LTX SZ6 (GLOVE) ×2 IMPLANT
GLOVE SURG ENC MOIS LTX SZ7 (GLOVE) ×2 IMPLANT
GLOVE SURG UNDER LTX SZ6.5 (GLOVE) ×4 IMPLANT
GLOVE SURG UNDER POLY LF SZ7.5 (GLOVE) ×2 IMPLANT
GOWN STRL REUS W/ TWL LRG LVL3 (GOWN DISPOSABLE) ×2 IMPLANT
GOWN STRL REUS W/TWL LRG LVL3 (GOWN DISPOSABLE) ×4
HEAD CERAMIC DELTA 36 PLUS 1.5 (Hips) ×1 IMPLANT
HOLDER FOLEY CATH W/STRAP (MISCELLANEOUS) ×2 IMPLANT
KIT TURNOVER KIT A (KITS) ×1 IMPLANT
LINER NEUTRAL 36X58 PLUS4 ×1 IMPLANT
PACK ANTERIOR HIP CUSTOM (KITS) ×2 IMPLANT
PINNACLE SECTOR CUP 58MM (Hips) ×2 IMPLANT
SCREW 6.5MMX25MM (Screw) ×1 IMPLANT
SPONGE T-LAP 18X18 ~~LOC~~+RFID (SPONGE) ×6 IMPLANT
STEM FEMORAL SZ5 HIGH ACTIS (Stem) ×1 IMPLANT
SUT MNCRL AB 4-0 PS2 18 (SUTURE) ×2 IMPLANT
SUT STRATAFIX 0 PDS 27 VIOLET (SUTURE) ×2
SUT VIC AB 1 CT1 36 (SUTURE) ×6 IMPLANT
SUT VIC AB 2-0 CT1 27 (SUTURE) ×4
SUT VIC AB 2-0 CT1 TAPERPNT 27 (SUTURE) ×2 IMPLANT
SUTURE STRATFX 0 PDS 27 VIOLET (SUTURE) ×1 IMPLANT
TRAY FOLEY MTR SLVR 16FR STAT (SET/KITS/TRAYS/PACK) IMPLANT
TUBE SUCTION HIGH CAP CLEAR NV (SUCTIONS) ×2 IMPLANT
WATER STERILE IRR 1000ML POUR (IV SOLUTION) ×2 IMPLANT

## 2021-07-12 NOTE — Discharge Instructions (Signed)

## 2021-07-12 NOTE — Anesthesia Postprocedure Evaluation (Signed)
Anesthesia Post Note ? ?Patient: Belinda [name] ? ?Procedure(s) Performed: TOTAL HIP ARTHROPLASTY ANTERIOR APPROACH (Right: Hip) ? ?  ? ?Patient location during evaluation: PACU ?Anesthesia Type: General ?Level of consciousness: awake and alert ?Pain management: pain level controlled ?Vital Signs Assessment: post-procedure vital signs reviewed and stable ?Respiratory status: spontaneous breathing, nonlabored ventilation, respiratory function stable and patient connected to nasal cannula oxygen ?Cardiovascular status: blood pressure returned to baseline and stable ?Postop Assessment: no apparent nausea or vomiting ?Anesthetic complications: no ? ? ?No notable events documented. ? ?Last Vitals:  ?Vitals:  ? 07/12/21 1403 07/12/21 1415  ?BP:  133/74  ?Pulse: 85 83  ?Resp: 18 14  ?Temp:  36.6 ?C  ?SpO2: 97% 95%  ?  ?Last Pain:  ?Vitals:  ? 07/12/21 1415  ?TempSrc:   ?PainSc: Asleep  ? ? ?  ?  ?  ?  ?  ?  ? ?Rakesh Dutko S ? ? ? ? ?

## 2021-07-12 NOTE — Evaluation (Signed)
Physical Therapy Evaluation ?Patient Details ?Name: Belinda [name] ?MRN: [id number] ?DOB: [birth year] ?Today's Date: 07/12/2021 ? ?History of Present Illness ? 71 y.o. female admitted 07/12/21 for R AA-THA. PMH includes B TKA, L distal radius fx s/p ORIF 2017.  ?Clinical Impression ? Pt is s/p THA resulting in the deficits listed below (see PT Problem List). Pt ambulated ~4' with RW for transfer from bed to bedside commode, then to recliner. Ambulation distance limited by "wooziness", vital signs stable. Initiated THA HEP. Good progress expected.  Pt will benefit from skilled PT to increase their independence and safety with mobility to allow discharge to the venue listed below.  ?   ?   ? ?Recommendations for follow up therapy are one component of a multi-disciplinary discharge planning process, led by the attending physician.  Recommendations may be updated based on patient status, additional functional criteria and insurance authorization. ? ?Follow Up Recommendations Follow physician's recommendations for discharge plan and follow up therapies ? ?  ?Assistance Recommended at Discharge Intermittent Supervision/Assistance  ?Patient can return home with the following ? Assistance with cooking/housework;Assist for transportation;Help with stairs or ramp for entrance;A little help with bathing/dressing/bathroom ? ?  ?Equipment Recommendations None recommended by PT  ?Recommendations for Other Services ?    ?  ?Functional Status Assessment Patient has had a recent decline in their functional status and demonstrates the ability to make significant improvements in function in a reasonable and predictable amount of time.  ? ?  ?Precautions / Restrictions Precautions ?Precautions: Fall ?Restrictions ?Weight Bearing Restrictions: No  ? ?  ? ?Mobility ? Bed Mobility ?Overal bed mobility: Needs Assistance ?Bed Mobility: Supine to Sit ?  ?  ?Supine to sit: HOB elevated, Min assist ?  ?  ?General bed mobility comments: increased  time, used bedrail, gait belt looped on R foot as leg lifter ?  ? ?Transfers ?Overall transfer level: Needs assistance ?Equipment used: Rolling walker (2 wheels) ?Transfers: Sit to/from Stand, Bed to chair/wheelchair/BSC ?Sit to Stand: Min assist ?  ?Step pivot transfers: Min guard ?  ?  ?  ?General transfer comment: VCs for hand placement, min A to power up, SPT bed to 3 in 1 ?  ? ?Ambulation/Gait ?Ambulation/Gait assistance: Min guard ?Gait Distance (Feet): 4 Feet ?Assistive device: Rolling walker (2 wheels) ?Gait Pattern/deviations: Step-to pattern ?Gait velocity: decr ?  ?  ?General Gait Details: assist to advance RLE, VCs sequencing,  took a few steps from bedside commode to recliner, distance limited by fatigue and "wooziness", vitals stable. BP 121/70 sitting, HR 80, SaO2 94% on room air ? ?Stairs ?  ?  ?  ?  ?  ? ?Wheelchair Mobility ?  ? ?Modified Rankin (Stroke Patients Only) ?  ? ?  ? ?Balance Overall balance assessment: Needs assistance ?Sitting-balance support: Feet supported ?Sitting balance-Leahy Scale: Good ?  ?  ?Standing balance support: Bilateral upper extremity supported, Reliant on assistive device for balance ?Standing balance-Leahy Scale: Poor ?  ?  ?  ?  ?  ?  ?  ?  ?  ?  ?  ?  ?   ? ? ? ?Pertinent Vitals/Pain Pain Assessment ?Pain Assessment: 0-10 ?Pain Score: 3  ?Pain Location: R hip ?Pain Descriptors / Indicators: Aching ?Pain Intervention(s): Limited activity within patient's tolerance, Monitored during session, Repositioned, Ice applied  ? ? ?Home Living Family/patient expects to be discharged to:: Private residence ?Living Arrangements: Alone ?Available Help at Discharge: Family;Available 24 hours/day ?Type of Home: House ?Home Access: Stairs  to enter ?Entrance Stairs-Rails: None ?Entrance Stairs-Number of Steps: 2 ?  ?Home Layout: One level ?Home Equipment: Conservation officer, nature (2 wheels);BSC/3in1;Toilet riser;Cane - single point ?   ?  ?Prior Function Prior Level of Function :  Independent/Modified Independent;Driving ?  ?  ?  ?  ?  ?  ?Mobility Comments: walked with RW PTA, no falls in past 6 months ?ADLs Comments: independent ?  ? ? ?Hand Dominance  ?   ? ?  ?Extremity/Trunk Assessment  ? Upper Extremity Assessment ?Upper Extremity Assessment: Overall WFL for tasks assessed ?  ? ?Lower Extremity Assessment ?Lower Extremity Assessment: RLE deficits/detail ?RLE Deficits / Details: hip flexion +2/5, knee ext at least 3/5 ?RLE Sensation: WNL ?  ? ?Cervical / Trunk Assessment ?Cervical / Trunk Assessment: Normal  ?Communication  ? Communication: No difficulties  ?Cognition Arousal/Alertness: Awake/alert ?Behavior During Therapy: [facility] for tasks assessed/performed ?Overall Cognitive Status: Within Functional Limits for tasks assessed ?  ?  ?  ?  ?  ?  ?  ?  ?  ?  ?  ?  ?  ?  ?  ?  ?  ?  ?  ? ?  ?General Comments   ? ?  ?Exercises Total Joint Exercises ?Ankle Circles/Pumps: AROM, Both, 10 reps, Supine ?Heel Slides: AAROM, Right, 10 reps, Supine ?Hip ABduction/ADduction: AAROM, Right, 10 reps, Supine ?Long Arc Quad: AROM, Right, 5 reps, Seated  ? ?Assessment/Plan  ?  ?PT Assessment Patient needs continued PT services  ?PT Problem List Decreased strength;Decreased mobility;Decreased activity tolerance;Pain ? ?   ?  ?PT Treatment Interventions Therapeutic activities;Therapeutic exercise;Gait training;Functional mobility training;Patient/family education;Balance training;DME instruction   ? ?PT Goals (Current goals can be found in the Care Plan section)  ?Acute Rehab PT Goals ?Patient Stated Goal: to walk farther, likes doing needlepoint/sewing ?PT Goal Formulation: With patient/family ?Time For Goal Achievement: 07/19/21 ?Potential to Achieve Goals: Good ? ?  ?Frequency BID ?  ? ? ?Co-evaluation   ?  ?  ?  ?  ? ? ?  ?AM-PAC PT "6 Clicks" Mobility  ?Outcome Measure Help needed turning from your back to your side while in a flat bed without using bedrails?: A Little ?Help needed moving from lying on  your back to sitting on the side of a flat bed without using bedrails?: A Lot ?Help needed moving to and from a bed to a chair (including a wheelchair)?: A Little ?Help needed standing up from a chair using your arms (e.g., wheelchair or bedside chair)?: A Little ?Help needed to walk in hospital room?: A Lot ?Help needed climbing 3-5 steps with a railing? : Total ?6 Click Score: 14 ? ?  ?End of Session Equipment Utilized During Treatment: Gait belt ?Activity Tolerance: Patient tolerated treatment well ?Patient left: in chair;with chair alarm set;with call bell/phone within reach;with family/visitor present ?Nurse Communication: Mobility status ?PT Visit Diagnosis: Difficulty in walking, not elsewhere classified (R26.2);Pain ?Pain - Right/Left: Right ?Pain - part of body: Hip ?  ? ?Time: 2376-2831 ?PT Time Calculation (min) (ACUTE ONLY): 38 min ? ? ?Charges:   PT Evaluation ?$PT Eval Moderate Complexity: 1 Mod ?PT Treatments ?$Therapeutic Exercise: 8-22 mins ?$Therapeutic Activity: 8-22 mins ?  ?   ? ?Blondell Reveal Kistler PT 07/12/2021  ?Acute Rehabilitation Services ?Pager [phone] ?Office [phone] ? ? ?

## 2021-07-12 NOTE — Op Note (Signed)
? NAME:  Belinda [name]                ACCOUNT NO.: [id number]  ?   ? MEDICAL RECORD NO.: [id number]  ?   ? FACILITY:  [facility]  ?   ? PHYSICIAN:  Mauri Pole  DATE OF BIRTH:  May 08, 1950 ?   ? DATE OF PROCEDURE:  07/12/2021 ?   ?                             OPERATIVE REPORT  ?   ?   ? PREOPERATIVE DIAGNOSIS: Right  hip osteoarthritis.  ?   ? POSTOPERATIVE DIAGNOSIS:  Right hip osteoarthritis.  ?   ? PROCEDURE:  Right total hip replacement through an anterior approach  ? utilizing DePuy THR system, component size 58 mm pinnacle cup, a size 36+4 neutral  ? Altrex liner, a size 5 Hi Actis stem with a 36+1.5 delta ceramic  ? ball.  ?   ? SURGEON:  Pietro Cassis. Alvan Dame, M.D.  ?   ? ASSISTANT:  Costella Hatcher, PA-C ?   ? ANESTHESIA:  General.  ?   ? SPECIMENS:  None.  ?   ? COMPLICATIONS:  None.  ?   ? BLOOD LOSS:  650 cc ?   ? DRAINS:  None.  ?   ? INDICATION OF THE PROCEDURE:  Belinda [name] is a 71 y.o. female who had  ? presented to office for evaluation of right hip pain.  Radiographs revealed  ? progressive degenerative changes with bone-on-bone  ? articulation of the  hip joint, including subchondral cystic changes and osteophytes.  The patient had painful limited range of  ? motion significantly affecting their overall quality of life and function.  The patient was failing to   ? respond to conservative measures including medications and/or injections and activity modification and at this point was ready  ? to proceed with more definitive measures.  Consent was obtained for  ? benefit of pain relief.  Specific risks of infection, DVT, component  ? failure, dislocation, neurovascular injury, and need for revision surgery were reviewed in the office as well discussion of  ? the anterior versus posterior approach were reviewed. ?   ? PROCEDURE IN DETAIL:  The patient was brought to operative theater.  ? Once adequate anesthesia, preoperative antibiotics, 2 gm of Ancef, 1 gm of Tranexamic Acid,  and 10 mg of Decadron were administered, the patient was positioned supine on the [employer] table.  Once the patient was safely positioned with adequate padding of boney prominences we predraped out the hip, and used fluoroscopy to confirm orientation of the pelvis.  ?   ? The right hip was then prepped and draped from proximal iliac crest to  ? mid thigh with a shower curtain technique.  ?   ? Time-out was performed identifying the patient, planned procedure, and the appropriate extremity.   ? ? An incision was then made 2 cm lateral to the  ? anterior superior iliac spine extending over the orientation of the  ? tensor fascia lata muscle and sharp dissection was carried down to the  ? fascia of the muscle.  ?   ? The fascia was then incised.  The muscle belly was identified and swept  ? laterally and retractor placed along the superior neck.  Following  ? cauterization of the circumflex vessels and removing some  pericapsular  ? fat, a second cobra retractor was placed on the inferior neck.  A T-capsulotomy was made along the line of the  ? superior neck to the trochanteric fossa, then extended proximally and  ? distally.  Tag sutures were placed and the retractors were then placed  ? intracapsular.  We then identified the trochanteric fossa and  ? orientation of my neck cut and then made a neck osteotomy with the femur on traction.  The femoral  ? head was removed without difficulty or complication.  Traction was let  ? off and retractors were placed posterior and anterior around the  ? acetabulum.  ?   ? The labrum and foveal tissue were debrided.  I began reaming with a 50 mm  ? reamer and reamed up to 57 mm reamer with good bony bed preparation and a 58 mm ? cup was chosen.  The final 58 mm Pinnacle cup was then impacted under fluoroscopy to confirm the depth of penetration and orientation with respect to  ? Abduction and forward flexion.  A screw was placed into the ilium followed by the hole eliminator.  The  final  ? 36+4 neutral Altrex liner was impacted with good visualized rim fit.  The cup was positioned anatomically within the acetabular portion of the pelvis.  ?   ? At this point, the femur was rolled to 100 degrees.  Further capsule was  ? released off the inferior aspect of the femoral neck.  I then  ? released the superior capsule proximally.  With the leg in a neutral position the hook was placed laterally  ? along the femur under the vastus lateralis origin and elevated manually and then held in position using the hook attachment on the bed.  The leg was then extended and adducted with the leg rolled to 100  ? degrees of external rotation.  Retractors were placed along the medial calcar and posteriorly over the greater trochanter.  Once the proximal femur was fully  ? exposed, I used a box osteotome to set orientation.  I then began  ? broaching with the starting chili pepper broach and passed this by hand and then broached up to 5.  With the 5 broach in place I chose a high offset neck and did several trial reductions.  The offset was appropriate, leg lengths  ? appeared to be equal best matched with the +1.5 head ball trial confirmed radiographically.  ? Given these findings, I went ahead and dislocated the hip, repositioned all  ? retractors and positioned the right hip in the extended and abducted position.  ?The final 5 Hi Actis stem was  ? chosen and it was impacted down to the level of neck cut.  Based on this  ? and the trial reductions, a final 36+1.5 delta ceramic ball was chosen and  ? impacted onto a clean and dry trunnion, and the hip was reduced.  The  ? hip had been irrigated throughout the case again at this point.  I did  ? reapproximate the superior capsular leaflet to the anterior leaflet  ? using #1 Vicryl.  The fascia of the  ? tensor fascia lata muscle was then reapproximated using #1 Vicryl and #0 Stratafix sutures.  The  ? remaining wound was closed with 2-0 Vicryl and running 4-0  Monocryl.  ? The hip was cleaned, dried, and dressed sterilely using Dermabond and  ? Aquacel dressing.  The patient was then brought  ? to  recovery room in stable condition tolerating the procedure well.  ? ? Costella Hatcher, PA-C was present for the entirety of the case involved from  ? preoperative positioning, perioperative retractor management, general  ? facilitation of the case, as well as primary wound closure as assistant.  ?   ?   ?   ? Pietro Cassis Alvan Dame, M.D.  ?   ?   ?07/12/2021 1:11 PM  ?   ?   ?

## 2021-07-12 NOTE — Interval H&P Note (Signed)
History and Physical Interval Note: ? ?07/12/2021 ?10:06 AM ? ?Belinda [name]  has presented today for surgery, with the diagnosis of Right hip osteoarthritis.  The various methods of treatment have been discussed with the patient and family. After consideration of risks, benefits and other options for treatment, the patient has consented to  Procedure(s): ?TOTAL HIP ARTHROPLASTY ANTERIOR APPROACH (Right) as a surgical intervention.  The patient's history has been reviewed, patient examined, no change in status, stable for surgery.  I have reviewed the patient's chart and labs.  Questions were answered to the patient's satisfaction.   ? ? ?Mauri Pole ? ? ?

## 2021-07-12 NOTE — Transfer of Care (Signed)
Immediate Anesthesia Transfer of Care Note ? ?Patient: Belinda [name] ? ?Procedure(s) Performed: TOTAL HIP ARTHROPLASTY ANTERIOR APPROACH (Right: Hip) ? ?Patient Location: PACU ? ?Anesthesia Type:General ? ?Level of Consciousness: awake ? ?Airway & Oxygen Therapy: Patient Spontanous Breathing and Patient connected to face mask oxygen ? ?Post-op Assessment: Report given to RN and Post -op Vital signs reviewed and stable ? ?Post vital signs: Reviewed and stable ? ?Last Vitals:  ?Vitals Value Taken Time  ?BP 166/99 07/12/21 1333  ?Temp    ?Pulse 86 07/12/21 1336  ?Resp 16 07/12/21 1336  ?SpO2 97 % 07/12/21 1336  ?Vitals shown include unvalidated device data. ? ?Last Pain:  ?Vitals:  ? 07/12/21 0923  ?TempSrc: Oral  ?   ? ?  ? ?Complications: No notable events documented. ?

## 2021-07-12 NOTE — Anesthesia Procedure Notes (Signed)
Procedure Name: Intubation ?Date/Time: 07/12/2021 11:32 AM ?Performed by: Sharlette Dense, CRNA ?Pre-anesthesia Checklist: Patient identified, Emergency Drugs available, Suction available and Patient being monitored ?Patient Re-evaluated:Patient Re-evaluated prior to induction ?Oxygen Delivery Method: Circle system utilized ?Preoxygenation: Pre-oxygenation with 100% oxygen ?Induction Type: IV induction ?Ventilation: Mask ventilation without difficulty ?Laryngoscope Size: Sabra Heck and 2 ?Grade View: Grade I ?Tube type: Oral ?Tube size: 7.5 mm ?Number of attempts: 1 ?Airway Equipment and Method: Stylet and Oral airway ?Placement Confirmation: ETT inserted through vocal cords under direct vision, positive ETCO2 and breath sounds checked- equal and bilateral ?Secured at: 21 cm ?Tube secured with: Tape ?Dental Injury: Teeth and Oropharynx as per pre-operative assessment  ? ? ? ? ?

## 2021-07-12 NOTE — Anesthesia Preprocedure Evaluation (Signed)
Anesthesia Evaluation  ?Patient identified by MRN, date of birth, ID band ?Patient awake ? ? ? ?Reviewed: ?Allergy & Precautions, NPO status , Patient's Chart, lab work & pertinent test results ? ?Airway ?Mallampati: II ? ?TM Distance: >3 FB ?Neck ROM: Full ? ? ? Dental ?no notable dental hx. ? ?  ?Pulmonary ?asthma , sleep apnea ,  ?  ?Pulmonary exam normal ?breath sounds clear to auscultation ? ? ? ? ? ? Cardiovascular ?negative cardio ROS ?Normal cardiovascular exam ?Rhythm:Regular Rate:Normal ? ? ?  ?Neuro/Psych ?negative neurological ROS ? negative psych ROS  ? GI/Hepatic ?Neg liver ROS, GERD  ,  ?Endo/Other  ?Hypothyroidism  ? Renal/GU ?negative Renal ROS  ?negative genitourinary ?  ?Musculoskeletal ? ?(+) Arthritis , Osteoarthritis,   ? Abdominal ?  ?Peds ?negative pediatric ROS ?(+)  Hematology ?negative hematology ROS ?(+)   ?Anesthesia Other Findings ? ? Reproductive/Obstetrics ?negative OB ROS ? ?  ? ? ? ? ? ? ? ? ? ? ? ? ? ?  ?  ? ? ? ? ? ? ? ? ?Anesthesia Physical ?Anesthesia Plan ? ?ASA: 2 ? ?Anesthesia Plan: Spinal  ? ?Post-op Pain Management: Regional block*  ? ?Induction: Intravenous ? ?PONV Risk Score and Plan: 2 and Ondansetron, Propofol infusion and Treatment may vary due to age or medical condition ? ?Airway Management Planned: Simple Face Mask ? ?Additional Equipment:  ? ?Intra-op Plan:  ? ?Post-operative Plan:  ? ?Informed Consent: I have reviewed the patients History and Physical, chart, labs and discussed the procedure including the risks, benefits and alternatives for the proposed anesthesia with the patient or authorized representative who has indicated his/her understanding and acceptance.  ? ? ? ?Dental advisory given ? ?Plan Discussed with: CRNA and Surgeon ? ?Anesthesia Plan Comments:   ? ? ? ? ? ? ?Anesthesia Quick Evaluation ? ?

## 2021-07-13 ENCOUNTER — Encounter (HOSPITAL_COMMUNITY): Payer: Self-pay | Admitting: Orthopedic Surgery

## 2021-07-13 LAB — BASIC METABOLIC PANEL
Anion gap: 10 (ref 5–15)
Anion gap: 8 (ref 5–15)
BUN: 16 mg/dL (ref 8–23)
BUN: 18 mg/dL (ref 8–23)
CO2: 24 mmol/L (ref 22–32)
CO2: 25 mmol/L (ref 22–32)
Calcium: 9.1 mg/dL (ref 8.9–10.3)
Calcium: 9.2 mg/dL (ref 8.9–10.3)
Chloride: 100 mmol/L (ref 98–111)
Chloride: 101 mmol/L (ref 98–111)
Creatinine, Ser: 0.87 mg/dL (ref 0.44–1.00)
Creatinine, Ser: 0.8 mg/dL (ref 0.44–1.00)
GFR, Estimated: >60 mL/min (ref >60)
GFR, Estimated: >60 mL/min (ref >60)
Glucose, Bld: 145 mg/dL — ABNORMAL HIGH (ref 70–99)
Glucose, Bld: 155 mg/dL — ABNORMAL HIGH (ref 70–99)
Potassium: 5.3 mmol/L — ABNORMAL HIGH (ref 3.5–5.1)
Potassium: 4 mmol/L (ref 3.5–5.1)
Sodium: 134 mmol/L — ABNORMAL LOW (ref 135–145)
Sodium: 134 mmol/L — ABNORMAL LOW (ref 135–145)

## 2021-07-13 LAB — CBC
HCT: 36.8 % (ref 36.0–46.0)
Hemoglobin: 11.8 g/dL — ABNORMAL LOW (ref 12.0–15.0)
MCH: 28.4 pg (ref 26.0–34.0)
MCHC: 32.1 g/dL (ref 30.0–36.0)
MCV: 88.7 fL (ref 80.0–100.0)
Platelets: 224 10*3/uL (ref 150–400)
RBC: 4.15 MIL/uL (ref 3.87–5.11)
RDW: 13.9 % (ref 11.5–15.5)
WBC: 11.6 10*3/uL — ABNORMAL HIGH (ref 4.0–10.5)
nRBC: 0 % (ref 0.0–0.2)

## 2021-07-13 MED ORDER — POLYETHYLENE GLYCOL 3350 17 G PO PACK: 17.0000 g | PACK | Freq: Every day | ORAL | 0 refills | Status: DC | PRN

## 2021-07-13 MED ORDER — DOCUSATE SODIUM 100 MG PO CAPS: 100.0000 mg | ORAL_CAPSULE | Freq: Two times a day (BID) | ORAL | 0 refills | Status: DC

## 2021-07-13 MED ORDER — HYDROCODONE-ACETAMINOPHEN 5-325 MG PO TABS: 1.0000 | ORAL_TABLET | Freq: Four times a day (QID) | ORAL | 0 refills | Status: DC | PRN

## 2021-07-13 MED ORDER — METHOCARBAMOL 500 MG PO TABS
500.0000 mg | ORAL_TABLET | Freq: Four times a day (QID) | ORAL | 0 refills | Status: DC | PRN
Start: 2021-07-13 — End: 2021-11-16

## 2021-07-13 MED ORDER — ASPIRIN 81 MG PO CHEW: 81.0000 mg | CHEWABLE_TABLET | Freq: Two times a day (BID) | ORAL | 0 refills | Status: AC

## 2021-07-13 NOTE — Progress Notes (Signed)
? ?  Subjective: ?1 Day Post-Op Procedure(s) (LRB): ?TOTAL HIP ARTHROPLASTY ANTERIOR APPROACH (Right) ?Patient reports pain as mild.   ?Patient seen in rounds by Dr. Alvan Dame. ?Patient is resting in bed on exam this morning. No acute events overnight. Foley catheter removed. Has not been up with PT yet.  ?We will start therapy today.  ? ?Objective: ?Vital signs in last 24 hours: ?Temp:  [97.6 ?F (36.4 ?C)-98.3 ?F (36.8 ?C)] 98.3 ?F (36.8 ?C) (03/29 0550) ?Pulse Rate:  [69-89] 69 (03/29 0550) ?Resp:  [11-18] 16 (03/29 0550) ?BP: (112-166)/(20-99) 115/67 (03/29 0550) ?SpO2:  [94 %-100 %] 94 % (03/29 0550) ? ?Intake/Output from previous day: ? ?Intake/Output Summary (Last 24 hours) at 07/13/2021 0757 ?Last data filed at 07/13/2021 0600 ?Gross per 24 hour  ?Intake 3337.56 ml  ?Output 1250 ml  ?Net 2087.56 ml  ?  ? ?Intake/Output this shift: ?No intake/output data recorded. ? ?Labs: ?Recent Labs  ?  07/13/21 ?0333  ?HGB 11.8*  ? ?Recent Labs  ?  07/13/21 ?0333  ?WBC 11.6*  ?RBC 4.15  ?HCT 36.8  ?PLT 224  ? ?Recent Labs  ?  07/13/21 ?0333  ?NA 134*  ?K 5.3*  ?CL 100  ?CO2 24  ?BUN 16  ?CREATININE 0.87  ?GLUCOSE 145*  ?CALCIUM 9.1  ? ?No results for input(s): LABPT, INR in the last 72 hours. ? ?Exam: ?General - Patient is Alert and Oriented ?Extremity - Neurologically intact ?Sensation intact distally ?Intact pulses distally ?Dorsiflexion/Plantar flexion intact ?Dressing - dressing C/D/I ?Motor Function - intact, moving foot and toes well on exam.  ? ?Past Medical History:  ?Diagnosis Date  ? Anxiety   ? Arthritis   ? Asthma   ? Candidiasis   ? Cataracts, bilateral   ? CPAP (continuous positive airway pressure) dependence   ? Depression   ? Distal radius fracture, left   ? Dyslipidemia   ? GERD (gastroesophageal reflux disease)   ? Heart murmur   ? born with a functional murmur  ? Hematuria   ? Hypothyroidism   ? IBS (irritable bowel syndrome)   ? Osteopenia   ? Pneumonia   ? Seasonal allergies   ? Sleep apnea   ? Stress  incontinence   ? 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 ? ?Assessment/Plan: ?1 Day Post-Op Procedure(s) (LRB): ?TOTAL HIP ARTHROPLASTY ANTERIOR APPROACH (Right) ?Principal Problem: ?  S/P total right hip arthroplasty ? ?Estimated body mass index is 38.79 kg/m? as calculated from the following: ?  Height as of 07/01/21: '5\' 4"'$  (1.626 m). ?  Weight as of 07/01/21: 102.5 kg. ?Advance diet ?Up with therapy ?D/C IV fluids ? ?DVT Prophylaxis - Aspirin ?Weight bearing as tolerated. ? ?Plan is to go Home after hospital stay. Plan for discharge today after meeting goals with therapy. Follow up in the office in 2 weeks.  ? ?Griffith Citron, PA-C ?Orthopedic Surgery ?[phone] ?07/13/2021, 7:57 AM  ?

## 2021-07-13 NOTE — Progress Notes (Signed)
Physical Therapy Treatment ?Patient Details ?Name: Belinda [name] ?MRN: [id number] ?DOB: 1951-01-12 ?Today's Date: 07/13/2021 ? ? ?History of Present Illness 71 y.o. female admitted 07/12/21 for R AA-THA. PMH includes B TKA, L distal radius fx s/p ORIF 2017. ? ?  ?PT Comments  ? ? Pt is not able to advance her RLE when ambulating, she is requiring assist to do so. She ambulated 24' with continuous assist to advance RLE. Instructed pt in THA HEP. Will plan to see pt for a second session this afternoon. If she's not able to advance her RLE then, she will not be ready to DC home today.    ?Recommendations for follow up therapy are one component of a multi-disciplinary discharge planning process, led by the attending physician.  Recommendations may be updated based on patient status, additional functional criteria and insurance authorization. ? ?Follow Up Recommendations ? Follow physician's recommendations for discharge plan and follow up therapies ?  ?  ?Assistance Recommended at Discharge Intermittent Supervision/Assistance  ?Patient can return home with the following Assistance with cooking/housework;Assist for transportation;Help with stairs or ramp for entrance;A little help with bathing/dressing/bathroom ?  ?Equipment Recommendations ? None recommended by PT  ?  ?Recommendations for Other Services   ? ? ?  ?Precautions / Restrictions Precautions ?Precautions: Fall ?Restrictions ?Weight Bearing Restrictions: No  ?  ? ?Mobility ? Bed Mobility ?Overal bed mobility: Needs Assistance ?Bed Mobility: Supine to Sit ?  ?  ?Supine to sit: HOB elevated, Supervision ?  ?  ?General bed mobility comments: increased time, used bedrail, gait belt looped on R foot as leg lifter ?  ? ?Transfers ?Overall transfer level: Needs assistance ?Equipment used: Rolling walker (2 wheels) ?Transfers: Sit to/from Stand ?Sit to Stand: Min guard ?  ?  ?  ?  ?  ?General transfer comment: VCs for hand placement ?   ? ?Ambulation/Gait ?Ambulation/Gait assistance: Min assist ?Gait Distance (Feet): 24 Feet ?Assistive device: Rolling walker (2 wheels) ?Gait Pattern/deviations: Step-to pattern, Decreased weight shift to right ?Gait velocity: decr ?  ?  ?General Gait Details: pt unable to advance RLE independently, required assist to advance RLE throughout entire walk, VCs sequencing ? ? ?Stairs ?  ?  ?  ?  ?  ? ? ?Wheelchair Mobility ?  ? ?Modified Rankin (Stroke Patients Only) ?  ? ? ?  ?Balance Overall balance assessment: Needs assistance ?Sitting-balance support: Feet supported ?Sitting balance-Leahy Scale: Good ?  ?  ?Standing balance support: Bilateral upper extremity supported, Reliant on assistive device for balance ?Standing balance-Leahy Scale: Poor ?  ?  ?  ?  ?  ?  ?  ?  ?  ?  ?  ?  ?  ? ?  ?Cognition Arousal/Alertness: Awake/alert ?Behavior During Therapy: [facility] for tasks assessed/performed ?Overall Cognitive Status: Within Functional Limits for tasks assessed ?  ?  ?  ?  ?  ?  ?  ?  ?  ?  ?  ?  ?  ?  ?  ?  ?  ?  ?  ? ?  ?Exercises Total Joint Exercises ?Ankle Circles/Pumps: AROM, Both, 10 reps, Supine ?Quad Sets: AROM, Right, 5 reps, Supine ?Short Arc Quad: AROM, Right, 10 reps, Supine ?Heel Slides: AAROM, Right, 10 reps, Supine ?Hip ABduction/ADduction: AAROM, Right, 10 reps, Supine ?Long Arc Quad: AROM, Right, Seated, 10 reps ? ?  ?General Comments   ?  ?  ? ?Pertinent Vitals/Pain Pain Assessment ?Pain Score: 7  ?Pain Location: R hip ?Pain  Descriptors / Indicators: Aching ?Pain Intervention(s): Limited activity within patient's tolerance, Monitored during session, Premedicated before session, Patient requesting pain meds-RN notified, Ice applied  ? ? ?Home Living   ?  ?  ?  ?  ?  ?  ?  ?  ?  ?   ?  ?Prior Function    ?  ?  ?   ? ?PT Goals (current goals can now be found in the care plan section) Acute Rehab PT Goals ?Patient Stated Goal: to walk farther, likes doing needlepoint/sewing ?PT Goal Formulation: With  patient/family ?Time For Goal Achievement: 07/19/21 ?Potential to Achieve Goals: Good ?Progress towards PT goals: Progressing toward goals ? ?  ?Frequency ? ? ? BID ? ? ? ?  ?PT Plan Current plan remains appropriate  ? ? ?Co-evaluation   ?  ?  ?  ?  ? ?  ?AM-PAC PT "6 Clicks" Mobility   ?Outcome Measure ? Help needed turning from your back to your side while in a flat bed without using bedrails?: A Little ?Help needed moving from lying on your back to sitting on the side of a flat bed without using bedrails?: A Lot ?Help needed moving to and from a bed to a chair (including a wheelchair)?: A Little ?Help needed standing up from a chair using your arms (e.g., wheelchair or bedside chair)?: A Little ?Help needed to walk in hospital room?: A Lot ?Help needed climbing 3-5 steps with a railing? : Total ?6 Click Score: 14 ? ?  ?End of Session Equipment Utilized During Treatment: Gait belt ?Activity Tolerance: Patient tolerated treatment well ?Patient left: in chair;with chair alarm set;with call bell/phone within reach ?Nurse Communication: Mobility status ?PT Visit Diagnosis: Difficulty in walking, not elsewhere classified (R26.2);Pain ?Pain - Right/Left: Right ?Pain - part of body: Hip ?  ? ? ?Time: 0947-0962 ?PT Time Calculation (min) (ACUTE ONLY): 25 min ? ?Charges:  $Gait Training: 8-22 mins ?$Therapeutic Exercise: 8-22 mins          ?          ?Blondell Reveal Kistler PT 07/13/2021  ?Acute Rehabilitation Services ?Pager [phone] ?Office [phone] ? ? ?

## 2021-07-13 NOTE — TOC Progression Note (Signed)
Transition of Care (TOC) - Progression Note  ? ? ?Patient Details  ?Name: Belinda [name] ?MRN: [id number] ?Date of Birth: 12/01/1950 ? ?Transition of Care (TOC) CM/SW Contact  ?Tawanna Cooler, RN ?Phone Number: ?07/13/2021, 9:55 AM ? ?Clinical Narrative:    ?Spoke with patient at bedside.  She confirms that the plan is home with home exercises.  Patient states she has all needed DME at home.  States her sister and daughter will be assisting her while she recovers.  States no needs.   ? ? ?  ?

## 2021-07-13 NOTE — Progress Notes (Signed)
Physical Therapy Treatment ?Patient Details ?Name: Belinda [name] ?MRN: [id number] ?DOB: 1950-12-11 ?Today's Date: 07/13/2021 ? ? ?History of Present Illness 71 y.o. female admitted 07/12/21 for R AA-THA. PMH includes B TKA, L distal radius fx s/p ORIF 2017. ? ?  ?PT Comments  ? ? Pt ambulated 79' with RW, still requiring some assistance to advance RLE but mild improvement noticed. She would benefit from one more night in the hospital as she is slow to progress with mobility.    ?Recommendations for follow up therapy are one component of a multi-disciplinary discharge planning process, led by the attending physician.  Recommendations may be updated based on patient status, additional functional criteria and insurance authorization. ? ?Follow Up Recommendations ? Follow physician's recommendations for discharge plan and follow up therapies ?  ?  ?Assistance Recommended at Discharge Intermittent Supervision/Assistance  ?Patient can return home with the following Assistance with cooking/housework;Assist for transportation;Help with stairs or ramp for entrance;A little help with bathing/dressing/bathroom ?  ?Equipment Recommendations ? None recommended by PT  ?  ?Recommendations for Other Services   ? ? ?  ?Precautions / Restrictions Precautions ?Precautions: Fall ?Restrictions ?Weight Bearing Restrictions: No  ?  ? ?Mobility ? Bed Mobility ?Overal bed mobility: Needs Assistance ?Bed Mobility: Supine to Sit, Sit to Supine ?  ?  ?Supine to sit: HOB elevated, Supervision ?Sit to supine: Min assist ?  ?General bed mobility comments: min A for RLE into bed ?  ? ?Transfers ?Overall transfer level: Needs assistance ?Equipment used: Rolling walker (2 wheels) ?Transfers: Sit to/from Stand ?Sit to Stand: Min guard ?  ?  ?  ?  ?  ?General transfer comment: VCs for hand placement ?  ? ?Ambulation/Gait ?Ambulation/Gait assistance: Min assist ?Gait Distance (Feet): 36 Feet ?Assistive device: Rolling walker (2 wheels) ?Gait  Pattern/deviations: Step-to pattern, Decreased weight shift to right ?Gait velocity: decr ?  ?  ?General Gait Details: pt continues to have difficulty with advance RLE independently, required assist to advance RLE throughout first 1/2 of walk then was able to partially advance RLE but not able to fully clear floor, onging VCs for sequencing ? ? ?Stairs ?  ?  ?  ?  ?  ? ? ?Wheelchair Mobility ?  ? ?Modified Rankin (Stroke Patients Only) ?  ? ? ?  ?Balance Overall balance assessment: Needs assistance ?Sitting-balance support: Feet supported ?Sitting balance-Leahy Scale: Good ?  ?  ?Standing balance support: Bilateral upper extremity supported, Reliant on assistive device for balance ?Standing balance-Leahy Scale: Poor ?  ?  ?  ?  ?  ?  ?  ?  ?  ?  ?  ?  ?  ? ?  ?Cognition Arousal/Alertness: Awake/alert ?Behavior During Therapy: [facility] for tasks assessed/performed ?Overall Cognitive Status: Within Functional Limits for tasks assessed ?  ?  ?  ?  ?  ?  ?  ?  ?  ?  ?  ?  ?  ?  ?  ?  ?  ?  ?  ? ?  ?Exercises Total Joint Exercises ?Ankle Circles/Pumps: AROM, Both, 10 reps, Supine ?Quad Sets: AROM, Right, 5 reps, Supine ?Short Arc Quad: AROM, Right, 10 reps, Supine ?Heel Slides: AAROM, Right, 10 reps, Supine ?Hip ABduction/ADduction: AAROM, Right, 10 reps, Supine ?Long Arc Quad: AROM, Right, Seated, 10 reps ? ?  ?General Comments   ?  ?  ? ?Pertinent Vitals/Pain Pain Assessment ?Pain Score: 5  ?Pain Location: R hip ?Pain Descriptors / Indicators: Aching ?Pain Intervention(s):  Limited activity within patient's tolerance, Monitored during session, Premedicated before session, Ice applied  ? ? ?Home Living   ?  ?  ?  ?  ?  ?  ?  ?  ?  ?   ?  ?Prior Function    ?  ?  ?   ? ?PT Goals (current goals can now be found in the care plan section) Acute Rehab PT Goals ?Patient Stated Goal: to walk farther, likes doing needlepoint/sewing ?PT Goal Formulation: With patient/family ?Time For Goal Achievement: 07/19/21 ?Potential to Achieve  Goals: Good ?Progress towards PT goals: Progressing toward goals ? ?  ?Frequency ? ? ? BID ? ? ? ?  ?PT Plan Current plan remains appropriate  ? ? ?Co-evaluation   ?  ?  ?  ?  ? ?  ?AM-PAC PT "6 Clicks" Mobility   ?Outcome Measure ? Help needed turning from your back to your side while in a flat bed without using bedrails?: A Little ?Help needed moving from lying on your back to sitting on the side of a flat bed without using bedrails?: A Lot ?Help needed moving to and from a bed to a chair (including a wheelchair)?: A Little ?Help needed standing up from a chair using your arms (e.g., wheelchair or bedside chair)?: A Little ?Help needed to walk in hospital room?: A Lot ?Help needed climbing 3-5 steps with a railing? : Total ?6 Click Score: 14 ? ?  ?End of Session Equipment Utilized During Treatment: Gait belt ?Activity Tolerance: Patient tolerated treatment well ?Patient left: with call bell/phone within reach;in bed;with bed alarm set ?Nurse Communication: Mobility status ?PT Visit Diagnosis: Difficulty in walking, not elsewhere classified (R26.2);Pain ?Pain - Right/Left: Right ?Pain - part of body: Hip ?  ? ? ?Time: 2992-4268 ?PT Time Calculation (min) (ACUTE ONLY): 20 min ? ?Charges:  $Gait Training: 8-22 mins ?         ?          ? ?Philomena Doheny PT 07/13/2021  ?Acute Rehabilitation Services ?Pager [phone] ?Office [phone] ? ? ?

## 2021-07-13 NOTE — Care Plan (Signed)
Ortho Bundle Case Management Note ? ?Patient Details  ?Name: Belinda [name] ?MRN: [id number] ?Date of Birth: 03-03-1951 ? ?R THA on 07-12-21 ?DCP:  Home with sister and dtr ?DME:  No needs, has a RW ?PT:  HEP                ? ? ? ?DME Arranged:  N/A ?DME Agency:  NA ? ?HH Arranged:  NA ?[place] Agency:  NA ? ?Additional Comments: ?Please contact me with any questions of if this plan should need to change. ? ?Larwance Rote  [phone] ?07/13/2021, 10:29 AM ?  ?

## 2021-07-14 DIAGNOSIS — Z8051 Family history of malignant neoplasm of kidney: Secondary | ICD-10-CM | POA: Diagnosis not present

## 2021-07-14 DIAGNOSIS — E039 Hypothyroidism, unspecified: Secondary | ICD-10-CM | POA: Diagnosis present

## 2021-07-14 DIAGNOSIS — F32A Depression, unspecified: Secondary | ICD-10-CM | POA: Diagnosis present

## 2021-07-14 DIAGNOSIS — G4730 Sleep apnea, unspecified: Secondary | ICD-10-CM | POA: Diagnosis present

## 2021-07-14 DIAGNOSIS — K219 Gastro-esophageal reflux disease without esophagitis: Secondary | ICD-10-CM | POA: Diagnosis present

## 2021-07-14 DIAGNOSIS — Z8249 Family history of ischemic heart disease and other diseases of the circulatory system: Secondary | ICD-10-CM | POA: Diagnosis not present

## 2021-07-14 DIAGNOSIS — M1611 Unilateral primary osteoarthritis, right hip: Secondary | ICD-10-CM | POA: Diagnosis present

## 2021-07-14 DIAGNOSIS — Z79890 Hormone replacement therapy (postmenopausal): Secondary | ICD-10-CM | POA: Diagnosis not present

## 2021-07-14 DIAGNOSIS — Z800 Family history of malignant neoplasm of digestive organs: Secondary | ICD-10-CM | POA: Diagnosis not present

## 2021-07-14 DIAGNOSIS — Z880 Allergy status to penicillin: Secondary | ICD-10-CM | POA: Diagnosis not present

## 2021-07-14 DIAGNOSIS — M8580 Other specified disorders of bone density and structure, unspecified site: Secondary | ICD-10-CM | POA: Diagnosis present

## 2021-07-14 DIAGNOSIS — Z96653 Presence of artificial knee joint, bilateral: Secondary | ICD-10-CM | POA: Diagnosis present

## 2021-07-14 DIAGNOSIS — J45909 Unspecified asthma, uncomplicated: Secondary | ICD-10-CM | POA: Diagnosis present

## 2021-07-14 DIAGNOSIS — Z801 Family history of malignant neoplasm of trachea, bronchus and lung: Secondary | ICD-10-CM | POA: Diagnosis not present

## 2021-07-14 DIAGNOSIS — F419 Anxiety disorder, unspecified: Secondary | ICD-10-CM | POA: Diagnosis present

## 2021-07-14 DIAGNOSIS — Z79899 Other long term (current) drug therapy: Secondary | ICD-10-CM | POA: Diagnosis not present

## 2021-07-14 DIAGNOSIS — E785 Hyperlipidemia, unspecified: Secondary | ICD-10-CM | POA: Diagnosis present

## 2021-07-14 DIAGNOSIS — Z7951 Long term (current) use of inhaled steroids: Secondary | ICD-10-CM | POA: Diagnosis not present

## 2021-07-14 LAB — CBC
HCT: 29.1 % — ABNORMAL LOW (ref 36.0–46.0)
Hemoglobin: 9.6 g/dL — ABNORMAL LOW (ref 12.0–15.0)
MCH: 29.3 pg (ref 26.0–34.0)
MCHC: 33 g/dL (ref 30.0–36.0)
MCV: 88.7 fL (ref 80.0–100.0)
Platelets: 181 10*3/uL (ref 150–400)
RBC: 3.28 MIL/uL — ABNORMAL LOW (ref 3.87–5.11)
RDW: 13.9 % (ref 11.5–15.5)
WBC: 10.7 10*3/uL — ABNORMAL HIGH (ref 4.0–10.5)
nRBC: 0 % (ref 0.0–0.2)

## 2021-07-14 NOTE — Progress Notes (Signed)
? ?  Subjective: ?2 Days Post-Op Procedure(s) (LRB): ?TOTAL HIP ARTHROPLASTY ANTERIOR APPROACH (Right) ?Patient reports pain as mild.   ?Patient seen in rounds for Dr. Alvan Dame. ?Patient is resting in bed this morning. She reports her hip is sore, but feels better than prior to surgery already. Ambulated 36 feet with PT today.  ?We will continue therapy today.  ? ?Objective: ?Vital signs in last 24 hours: ?Temp:  [98 ?F (36.7 ?C)-98.6 ?F (37 ?C)] 98.6 ?F (37 ?C) (03/30 3662) ?Pulse Rate:  [73-88] 82 (03/30 9476) ?Resp:  [16-18] 16 (03/30 5465) ?BP: (97-130)/(56-68) 125/68 (03/30 0354) ?SpO2:  [90 %-97 %] 97 % (03/30 0619) ?Weight:  [102.5 kg] 102.5 kg (03/29 1634) ? ?Intake/Output from previous day: ? ?Intake/Output Summary (Last 24 hours) at 07/14/2021 0756 ?Last data filed at 07/14/2021 0600 ?Gross per 24 hour  ?Intake 780 ml  ?Output --  ?Net 780 ml  ?  ? ?Intake/Output this shift: ?No intake/output data recorded. ? ?Labs: ?Recent Labs  ?  07/13/21 ?0333 07/14/21 ?0328  ?HGB 11.8* 9.6*  ? ?Recent Labs  ?  07/13/21 ?0333 07/14/21 ?0328  ?WBC 11.6* 10.7*  ?RBC 4.15 3.28*  ?HCT 36.8 29.1*  ?PLT 224 181  ? ?Recent Labs  ?  07/13/21 ?0333 07/13/21 ?1203  ?NA 134* 134*  ?K 5.3* 4.0  ?CL 100 101  ?CO2 24 25  ?BUN 16 18  ?CREATININE 0.87 0.80  ?GLUCOSE 145* 155*  ?CALCIUM 9.1 9.2  ? ?No results for input(s): LABPT, INR in the last 72 hours. ? ?Exam: ?General - Patient is Alert and Oriented ?Extremity - Neurologically intact ?Sensation intact distally ?Intact pulses distally ?Dorsiflexion/Plantar flexion intact ?Dressing - dressing C/D/I ?Motor Function - intact, moving foot and toes well on exam.  ? ?Past Medical History:  ?Diagnosis Date  ? Anxiety   ? Arthritis   ? Asthma   ? Candidiasis   ? Cataracts, bilateral   ? CPAP (continuous positive airway pressure) dependence   ? Depression   ? Distal radius fracture, left   ? Dyslipidemia   ? GERD (gastroesophageal reflux disease)   ? Heart murmur   ? born with a functional murmur   ? Hematuria   ? Hypothyroidism   ? IBS (irritable bowel syndrome)   ? Osteopenia   ? Pneumonia   ? Seasonal allergies   ? Sleep apnea   ? Stress incontinence   ? 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 ? ?Assessment/Plan: ?2 Days Post-Op Procedure(s) (LRB): ?TOTAL HIP ARTHROPLASTY ANTERIOR APPROACH (Right) ?Principal Problem: ?  S/P total right hip arthroplasty ? ?Estimated body mass index is 38.79 kg/m? as calculated from the following: ?  Height as of this encounter: '5\' 4"'$  (1.626 m). ?  Weight as of this encounter: 102.5 kg. ?Advance diet ?Up with therapy ?D/C IV fluids ? ?DVT Prophylaxis - Aspirin ?Weight bearing as tolerated. ? ?Hgb stable at 9.6 this AM. ? ?Plan is to go Home after hospital stay. Plan for discharge today after meeting goals with therapy. Follow up in the office in 2 weeks.  ? ?Griffith Citron, PA-C ?Orthopedic Surgery ?[phone] ?07/14/2021, 7:56 AM  ?

## 2021-07-14 NOTE — Progress Notes (Addendum)
Physical Therapy Treatment ?Patient Details ?Name: Belinda [name] ?MRN: [id number] ?DOB: August 12, 1950 ?Today's Date: 07/14/2021 ? ? ?History of Present Illness 71 y.o. female admitted 07/12/21 for R AA-THA. PMH includes B TKA, L distal radius fx s/p ORIF 2017. ? ?  ?PT Comments  ? ? Pt is progressing with mobility and is ready to DC home from a PT standpoint. She ambulated 72' with a RW and was able to advance her RLE for short steps independently today. Stair training completed. Pt demonstrates good understanding of HEP.  ?   ?Recommendations for follow up therapy are one component of a multi-disciplinary discharge planning process, led by the attending physician.  Recommendations may be updated based on patient status, additional functional criteria and insurance authorization. ? ?Follow Up Recommendations ? Follow physician's recommendations for discharge plan and follow up therapies ?  ?  ?Assistance Recommended at Discharge    ?Patient can return home with the following Assistance with cooking/housework;Assist for transportation;Help with stairs or ramp for entrance;A little help with bathing/dressing/bathroom ?  ?Equipment Recommendations ? None recommended by PT  ?  ?Recommendations for Other Services   ? ? ?  ?Precautions / Restrictions Precautions ?Precautions: Fall ?Restrictions ?Weight Bearing Restrictions: No  ?  ? ?Mobility ? Bed Mobility ?Overal bed mobility: Modified Independent ?Bed Mobility: Supine to Sit ?  ?  ?Supine to sit: Modified independent (Device/Increase time), HOB elevated ?  ?  ?General bed mobility comments: increased time, used gait belt as RLE lifter ?  ? ?Transfers ?Overall transfer level: Needs assistance ?Equipment used: Rolling walker (2 wheels) ?Transfers: Sit to/from Stand ?Sit to Stand: Modified independent (Device/Increase time) ?  ?  ?  ?  ?  ?General transfer comment: VCs for hand placement ?  ? ?Ambulation/Gait ?Ambulation/Gait assistance: Supervision ?Gait Distance (Feet): 75  Feet ?Assistive device: Rolling walker (2 wheels) ?Gait Pattern/deviations: Step-to pattern, Decreased weight shift to right ?Gait velocity: decr ?  ?  ?General Gait Details: pt able to advance RLE independently for short steps ? ? ?Stairs ?Stairs: Yes ?Stairs assistance: Min assist ?  ?Number of Stairs: 2 ?General stair comments: min A to steady RW; backwards, step to, no rails. ? ? ?Wheelchair Mobility ?  ? ?Modified Rankin (Stroke Patients Only) ?  ? ? ?  ?Balance Overall balance assessment: Needs assistance ?Sitting-balance support: Feet supported ?Sitting balance-Leahy Scale: Good ?  ?  ?Standing balance support: Bilateral upper extremity supported, Reliant on assistive device for balance ?Standing balance-Leahy Scale: Poor ?  ?  ?  ?  ?  ?  ?  ?  ?  ?  ?  ?  ?  ? ?  ?Cognition Arousal/Alertness: Awake/alert ?Behavior During Therapy: [facility] for tasks assessed/performed ?Overall Cognitive Status: Within Functional Limits for tasks assessed ?  ?  ?  ?  ?  ?  ?  ?  ?  ?  ?  ?  ?  ?  ?  ?  ?  ?  ?  ? ?  ?Exercises Total Joint Exercises ?Ankle Circles/Pumps: AROM, Both, 10 reps, Supine ?Quad Sets: AROM, Right, 5 reps, Supine ?Short Arc Quad: AROM, Right, 10 reps, Supine ?Heel Slides: AAROM, Right, 10 reps, Supine ?Hip ABduction/ADduction: AAROM, Right, 10 reps, Supine ?Long Arc Quad: AROM, Right, Seated, 10 reps ? ?  ?General Comments   ?  ?  ? ?Pertinent Vitals/Pain Pain Assessment ?Pain Score: 5  ?Pain Location: R hip ?Pain Descriptors / Indicators: Aching ?Pain Intervention(s): Limited activity within patient's  tolerance, Monitored during session, Premedicated before session, Ice applied  ? ? ?Home Living   ?  ?  ?  ?  ?  ?  ?  ?  ?  ?   ?  ?Prior Function    ?  ?  ?   ? ?PT Goals (current goals can now be found in the care plan section) Acute Rehab PT Goals ?Patient Stated Goal: to walk farther, likes doing needlepoint/sewing ?PT Goal Formulation: With patient/family ?Time For Goal Achievement: 07/19/21 ?Potential  to Achieve Goals: Good ?Progress towards PT goals: Progressing toward goals ? ?  ?Frequency ? ? ? BID ? ? ? ?  ?PT Plan Current plan remains appropriate  ? ? ?Co-evaluation   ?  ?  ?  ?  ? ?  ?AM-PAC PT "6 Clicks" Mobility   ?Outcome Measure ? Help needed turning from your back to your side while in a flat bed without using bedrails?: A Little ?Help needed moving from lying on your back to sitting on the side of a flat bed without using bedrails?: A Little ?Help needed moving to and from a bed to a chair (including a wheelchair)?: None ?Help needed standing up from a chair using your arms (e.g., wheelchair or bedside chair)?: None ?Help needed to walk in hospital room?: None ?Help needed climbing 3-5 steps with a railing? : A Little ?6 Click Score: 21 ? ?  ?End of Session Equipment Utilized During Treatment: Gait belt ?Activity Tolerance: Patient tolerated treatment well ?Patient left: with call bell/phone within reach;in bed;with bed alarm set ?Nurse Communication: Mobility status ?PT Visit Diagnosis: Difficulty in walking, not elsewhere classified (R26.2);Pain ?Pain - Right/Left: Right ?Pain - part of body: Hip ?  ? ? ?Time: 9147-8295 ?PT Time Calculation (min) (ACUTE ONLY): 27 min ? ?Charges:  $Gait Training: 8-22 mins ?$Therapeutic Exercise: 8-22 mins          ?          ? ?Philomena Doheny PT 07/14/2021  ?Acute Rehabilitation Services ?Pager [phone] ?Office [phone] ? ? ?

## 2021-07-22 NOTE — Discharge Summary (Signed)
Patient ID: ?Belinda [name] ?MRN: [id number] ?DOB/AGE: 05-22-1950 71 y.o. ? ?Admit date: 07/12/2021 ?Discharge date: 07/14/2021 ? ?Admission Diagnoses:  ?Right hip osteoarthritis ? ?Discharge Diagnoses:  ?Principal Problem: ?  S/P total right hip arthroplasty ? ? ?Past Medical History:  ?Diagnosis Date  ? Anxiety   ? Arthritis   ? Asthma   ? Candidiasis   ? Cataracts, bilateral   ? CPAP (continuous positive airway pressure) dependence   ? Depression   ? Distal radius fracture, left   ? Dyslipidemia   ? GERD (gastroesophageal reflux disease)   ? Heart murmur   ? born with a functional murmur  ? Hematuria   ? Hypothyroidism   ? IBS (irritable bowel syndrome)   ? Osteopenia   ? Pneumonia   ? Seasonal allergies   ? Sleep apnea   ? Stress incontinence   ? Toxic shock syndrome ([place])   ? ? ?Surgeries: Procedure(s): ?TOTAL HIP ARTHROPLASTY ANTERIOR APPROACH on 07/12/2021 ?  ?Consultants:  ? ?Discharged Condition: Improved ? ?Hospital Course: Belinda [name] is an 71 y.o. female who was admitted 07/12/2021 for operative treatment ofS/P total right hip arthroplasty. Patient has severe unremitting pain that affects sleep, daily activities, and work/hobbies. After pre-op clearance the patient was taken to the operating room on 07/12/2021 and underwent  Procedure(s): ?TOTAL HIP ARTHROPLASTY ANTERIOR APPROACH.   ? ?Patient was given perioperative antibiotics:  ?Anti-infectives (From admission, onward)  ? ? Start     Dose/Rate Route Frequency Ordered Stop  ? 07/12/21 1800  ceFAZolin (ANCEF) IVPB 2g/100 mL premix       ? 2 g ?200 mL/hr over 30 Minutes Intravenous Every 6 hours 07/12/21 1444 07/13/21 0019  ? 07/12/21 0900  ceFAZolin (ANCEF) IVPB 2g/100 mL premix       ? 2 g ?200 mL/hr over 30 Minutes Intravenous On call to O.R. 07/12/21 0859 07/12/21 1136  ? ?  ?  ? ?Patient was given sequential compression devices, early ambulation, and chemoprophylaxis to prevent DVT. Patient worked with PT and was meeting their goals regarding safe  ambulation and transfers. ? ?Patient benefited maximally from hospital stay and there were no complications.   ? ?Recent vital signs: No data found.  ? ?Recent laboratory studies: No results for input(s): WBC, HGB, HCT, PLT, NA, K, CL, CO2, BUN, CREATININE, GLUCOSE, INR, CALCIUM in the last 72 hours. ? ?Invalid input(s): PT, 2 ? ? ?Discharge Medications:   ?Allergies as of 07/14/2021   ? ?   Reactions  ? Penicillins Rash  ? Rash in 80's. ?Tolerated Cephalosporin 07/12/21  ? ?  ? ?  ?Medication List  ?  ? ?STOP taking these medications   ? ?diclofenac 75 MG EC tablet ?Commonly known as: VOLTAREN ?  ?ibuprofen 200 MG tablet ?Commonly known as: ADVIL ?  ? ?  ? ?TAKE these medications   ? ?Asmanex (60 Metered Doses) 220 MCG/ACT inhaler ?Generic drug: mometasone ?Inhale 2 puffs into the lungs daily as needed. ?  ?aspirin 81 MG chewable tablet ?Chew 1 tablet (81 mg total) by mouth 2 (two) times daily for 28 days. ?  ?BIOFREEZE EX ?Apply 1 application. topically daily as needed (pain). ?  ?cetirizine 10 MG tablet ?Commonly known as: ZYRTEC ?Take 10 mg by mouth daily. ?  ?clindamycin 150 MG capsule ?Commonly known as: CLEOCIN ?Take 600 mg by mouth once. For dental appt. ?  ?docusate sodium 100 MG capsule ?Commonly known as: COLACE ?Take 1 capsule (100 mg total) by mouth  2 (two) times daily. ?  ?fluticasone 50 MCG/ACT nasal spray ?Commonly known as: FLONASE ?Place 1 spray into both nostrils daily. ?  ?HYDROcodone-acetaminophen 5-325 MG tablet ?Commonly known as: NORCO/VICODIN ?Take 1-2 tablets by mouth every 6 (six) hours as needed for severe pain. ?  ?ICY HOT MAX LIDOCAINE EX ?Apply 1 application. topically daily as needed (pain). ?  ?levothyroxine 200 MCG tablet ?Commonly known as: SYNTHROID ?Take 200 mcg by mouth daily. ?  ?methocarbamol 500 MG tablet ?Commonly known as: ROBAXIN ?Take 1 tablet (500 mg total) by mouth every 6 (six) hours as needed for muscle spasms. ?  ?montelukast 10 MG tablet ?Commonly known as:  SINGULAIR ?Take 10 mg by mouth daily. ?  ?nystatin powder ?Generic drug: nystatin ?Apply 1 application. topically 2 (two) times daily as needed (yeast infection). ?  ?polyethylene glycol 17 g packet ?Commonly known as: MIRALAX / GLYCOLAX ?Take 17 g by mouth daily as needed for mild constipation. ?  ?SALONPAS EX ?Apply 1 application. topically daily as needed (pain). ?  ?sertraline 100 MG tablet ?Commonly known as: ZOLOFT ?Take 100 mg by mouth daily. ?  ?Ventolin HFA 108 (90 Base) MCG/ACT inhaler ?Generic drug: albuterol ?Inhale 1-2 puffs into the lungs every 4 (four) hours as needed. ?  ?vitamin C 500 MG tablet ?Commonly known as: ASCORBIC ACID ?Take 500 mg by mouth daily. ?  ?Vitamin D3 125 MCG (5000 UT) Caps ?Take 5,000 Units by mouth once a week. ?  ?Zinc 50 MG Tabs ?Take 50 mg by mouth daily. ?  ? ?  ? ?  ?  ? ? ?  ?Discharge Care Instructions  ?(From admission, onward)  ?  ? ? ?  ? ?  Start     Ordered  ? 07/13/21 0000  Change dressing       ?Comments: Maintain surgical dressing until follow up in the clinic. If the edges start to pull up, may reinforce with tape. If the dressing is no longer working, may remove and cover with gauze and tape, but must keep the area dry and clean.  Call with any questions or concerns.  ? 07/13/21 0800  ? ?  ?  ? ?  ? ? ?Diagnostic Studies: DG Pelvis Portable ? ?Result Date: 07/12/2021 ?CLINICAL DATA:  Postop right hip arthroplasty EXAM: PORTABLE PELVIS 1-2 VIEWS COMPARISON:  None. FINDINGS: Status post total right hip arthroplasty with intact hardware. No acute periprosthetic fracture. Mild-to-moderate osteoarthritis of the left hip joint. IMPRESSION: Status post right hip arthroplasty without evidence of acute complications. Electronically Signed   By: Keane Police D.O.   On: 07/12/2021 14:12  ? ?DG C-Arm 1-60 Min-No Report ? ?Result Date: 07/12/2021 ?Fluoroscopy was utilized by the requesting physician.  No radiographic interpretation.  ? ?DG C-Arm 1-60 Min-No Report ? ?Result  Date: 07/12/2021 ?Fluoroscopy was utilized by the requesting physician.  No radiographic interpretation.  ? ?DG HIP UNILAT WITH PELVIS 1V RIGHT ? ?Result Date: 07/12/2021 ?CLINICAL DATA:  Right total hip replacement anterior approach EXAM: DG HIP (WITH OR WITHOUT PELVIS) 1V RIGHT COMPARISON:  None. FINDINGS: Single AP view of the pelvis centered at the pubic symphysis is obtained. There is partial imaging of a right hip replacement. Femoral head prosthesis not in place. IMPRESSION: Right hip replacement.  Limited coverage of the area of interest. Electronically Signed   By: Franchot Gallo M.D.   On: 07/12/2021 13:14   ? ?Disposition: Discharge disposition: 01-Home or Self Care ? ? ? ? ? ? ?Discharge  Instructions   ? ? Call MD / Call 911   Complete by: As directed ?  ? If you experience chest pain or shortness of breath, CALL 911 and be transported to the hospital emergency room.  If you develope a fever above 101 F, pus (white drainage) or increased drainage or redness at the wound, or calf pain, call your surgeon's office.  ? Change dressing   Complete by: As directed ?  ? Maintain surgical dressing until follow up in the clinic. If the edges start to pull up, may reinforce with tape. If the dressing is no longer working, may remove and cover with gauze and tape, but must keep the area dry and clean.  Call with any questions or concerns.  ? Constipation Prevention   Complete by: As directed ?  ? Drink plenty of fluids.  Prune juice may be helpful.  You may use a stool softener, such as Colace (over the counter) 100 mg twice a day.  Use MiraLax (over the counter) for constipation as needed.  ? Diet - low sodium heart healthy   Complete by: As directed ?  ? Increase activity slowly as tolerated   Complete by: As directed ?  ? Weight bearing as tolerated with assist device (walker, cane, etc) as directed, use it as long as suggested by your surgeon or therapist, typically at least 4-6 weeks.  ? Post-operative opioid  taper instructions:   Complete by: As directed ?  ? POST-OPERATIVE OPIOID TAPER INSTRUCTIONS: ?It is important to wean off of your opioid medication as soon as possible. If you do not need pain medication after you

## 2021-08-12 DIAGNOSIS — Z20822 Contact with and (suspected) exposure to covid-19: Secondary | ICD-10-CM | POA: Diagnosis not present

## 2021-08-18 DIAGNOSIS — Z20822 Contact with and (suspected) exposure to covid-19: Secondary | ICD-10-CM | POA: Diagnosis not present

## 2021-08-24 DIAGNOSIS — Z4789 Encounter for other orthopedic aftercare: Secondary | ICD-10-CM | POA: Diagnosis not present

## 2021-09-27 DIAGNOSIS — J45909 Unspecified asthma, uncomplicated: Secondary | ICD-10-CM | POA: Diagnosis not present

## 2021-09-27 DIAGNOSIS — J4530 Mild persistent asthma, uncomplicated: Secondary | ICD-10-CM | POA: Diagnosis not present

## 2021-09-27 DIAGNOSIS — R051 Acute cough: Secondary | ICD-10-CM | POA: Diagnosis not present

## 2021-10-05 DIAGNOSIS — Z4789 Encounter for other orthopedic aftercare: Secondary | ICD-10-CM | POA: Diagnosis not present

## 2021-11-01 DIAGNOSIS — E039 Hypothyroidism, unspecified: Secondary | ICD-10-CM | POA: Diagnosis not present

## 2021-11-01 DIAGNOSIS — R7989 Other specified abnormal findings of blood chemistry: Secondary | ICD-10-CM | POA: Diagnosis not present

## 2021-11-01 DIAGNOSIS — M8589 Other specified disorders of bone density and structure, multiple sites: Secondary | ICD-10-CM | POA: Diagnosis not present

## 2021-11-01 DIAGNOSIS — R7301 Impaired fasting glucose: Secondary | ICD-10-CM | POA: Diagnosis not present

## 2021-11-01 DIAGNOSIS — E785 Hyperlipidemia, unspecified: Secondary | ICD-10-CM | POA: Diagnosis not present

## 2021-11-08 ENCOUNTER — Other Ambulatory Visit: Payer: Self-pay | Admitting: Internal Medicine

## 2021-11-08 DIAGNOSIS — J45909 Unspecified asthma, uncomplicated: Secondary | ICD-10-CM | POA: Diagnosis not present

## 2021-11-08 DIAGNOSIS — B351 Tinea unguium: Secondary | ICD-10-CM | POA: Diagnosis not present

## 2021-11-08 DIAGNOSIS — M1990 Unspecified osteoarthritis, unspecified site: Secondary | ICD-10-CM | POA: Diagnosis not present

## 2021-11-08 DIAGNOSIS — B379 Candidiasis, unspecified: Secondary | ICD-10-CM | POA: Diagnosis not present

## 2021-11-08 DIAGNOSIS — E039 Hypothyroidism, unspecified: Secondary | ICD-10-CM | POA: Diagnosis not present

## 2021-11-08 DIAGNOSIS — G4733 Obstructive sleep apnea (adult) (pediatric): Secondary | ICD-10-CM | POA: Diagnosis not present

## 2021-11-08 DIAGNOSIS — R911 Solitary pulmonary nodule: Secondary | ICD-10-CM

## 2021-11-08 DIAGNOSIS — R82998 Other abnormal findings in urine: Secondary | ICD-10-CM | POA: Diagnosis not present

## 2021-11-08 DIAGNOSIS — R635 Abnormal weight gain: Secondary | ICD-10-CM | POA: Diagnosis not present

## 2021-11-08 DIAGNOSIS — R7301 Impaired fasting glucose: Secondary | ICD-10-CM | POA: Diagnosis not present

## 2021-11-08 DIAGNOSIS — Z1339 Encounter for screening examination for other mental health and behavioral disorders: Secondary | ICD-10-CM | POA: Diagnosis not present

## 2021-11-08 DIAGNOSIS — N39498 Other specified urinary incontinence: Secondary | ICD-10-CM | POA: Diagnosis not present

## 2021-11-08 DIAGNOSIS — Z0000 Encounter for general adult medical examination without abnormal findings: Secondary | ICD-10-CM | POA: Diagnosis not present

## 2021-11-08 DIAGNOSIS — Z1331 Encounter for screening for depression: Secondary | ICD-10-CM | POA: Diagnosis not present

## 2021-11-15 NOTE — Progress Notes (Unsigned)
PATIENT: Belinda [name] DOB: October 13, 1950  REASON FOR VISIT: follow up HISTORY FROM: patient PRIMARY NEUROLOGIST: Dr. Brett Fairy  Chief Complaint  Patient presents with   Follow-up    RM 19 here for f/u on CPAP      HISTORY OF PRESENT ILLNESS: Today 11/16/21:  Belinda [name] is a 71 year old female with a history of obstructive sleep apnea on CPAP.  She returns today for follow-up.  She reports that CPAP is working well for her.  She denies any new issues.  She returns today for an evaluation.    REVIEW OF SYSTEMS: Out of a complete 14 system review of symptoms, the patient complains only of the following symptoms, and all other reviewed systems are negative.  FSS 23 ESS 13  ALLERGIES: Allergies  Allergen Reactions   Penicillins Rash    Rash in 80's. Tolerated Cephalosporin 07/12/21      HOME MEDICATIONS: Outpatient Medications Prior to Visit  Medication Sig Dispense Refill   ASMANEX 60 METERED DOSES 220 MCG/INH inhaler Inhale 2 puffs into the lungs daily as needed.      Camphor-Menthol-Methyl Sal (SALONPAS EX) Apply 1 application. topically daily as needed (pain).     cetirizine (ZYRTEC) 10 MG tablet Take 10 mg by mouth daily.     Cholecalciferol (VITAMIN D3) 125 MCG (5000 UT) CAPS Take 5,000 Units by mouth once a week.     clindamycin (CLEOCIN) 150 MG capsule Take 600 mg by mouth once. For dental appt.     docusate sodium (COLACE) 100 MG capsule Take 1 capsule (100 mg total) by mouth 2 (two) times daily. 10 capsule 0   fluticasone (FLONASE) 50 MCG/ACT nasal spray Place 1 spray into both nostrils daily.     HYDROcodone-acetaminophen (NORCO/VICODIN) 5-325 MG tablet Take 1-2 tablets by mouth every 6 (six) hours as needed for severe pain. 42 tablet 0   levothyroxine (SYNTHROID) 200 MCG tablet Take 200 mcg by mouth daily.     Lidocaine-Menthol (ICY HOT MAX LIDOCAINE EX) Apply 1 application. topically daily as needed (pain).     Menthol, Topical Analgesic, (BIOFREEZE EX)  Apply 1 application. topically daily as needed (pain).     methocarbamol (ROBAXIN) 500 MG tablet Take 1 tablet (500 mg total) by mouth every 6 (six) hours as needed for muscle spasms. 40 tablet 0   montelukast (SINGULAIR) 10 MG tablet Take 10 mg by mouth daily.     NYSTATIN powder Apply 1 application. topically 2 (two) times daily as needed (yeast infection).  2   polyethylene glycol (MIRALAX / GLYCOLAX) 17 g packet Take 17 g by mouth daily as needed for mild constipation. 14 each 0   sertraline (ZOLOFT) 100 MG tablet Take 100 mg by mouth daily.     VENTOLIN HFA 108 (90 Base) MCG/ACT inhaler Inhale 1-2 puffs into the lungs every 4 (four) hours as needed.   5   vitamin C (ASCORBIC ACID) 500 MG tablet Take 500 mg by mouth daily.     Zinc 50 MG TABS Take 50 mg by mouth daily.     No facility-administered medications prior to visit.    PAST MEDICAL HISTORY: Past Medical History:  Diagnosis Date   Anxiety    Arthritis    Asthma    Candidiasis    Cataracts, bilateral    CPAP (continuous positive airway pressure) dependence    Depression    Distal radius fracture, left    Dyslipidemia    GERD (gastroesophageal reflux disease)  Heart murmur    born with a functional murmur   Hematuria    Hypothyroidism    IBS (irritable bowel syndrome)    Osteopenia    Pneumonia    Seasonal allergies    Sleep apnea    Stress incontinence    Toxic shock syndrome ([place])     PAST SURGICAL HISTORY: Past Surgical History:  Procedure Laterality Date   CARPAL TUNNEL RELEASE Left 10/27/2015   Procedure: LEFT CARPAL TUNNEL RELEASE;  Surgeon: Charlotte Crumb, MD;  Location: [place];  Service: Orthopedics;  Laterality: Left;   CATARACT EXTRACTION     CATARACT EXTRACTION  2017   x 2   COLONOSCOPY     JOINT REPLACEMENT Bilateral    OPEN REDUCTION INTERNAL FIXATION (ORIF) DISTAL RADIAL FRACTURE Left 10/27/2015   Procedure: OPEN REDUCTION INTERNAL FIXATION (ORIF) LEFT  DISTAL RADIAL  FRACTURE;  Surgeon: Charlotte Crumb, MD;  Location: [place];  Service: Orthopedics;  Laterality: Left;   TONSILLECTOMY     TOTAL HIP ARTHROPLASTY Right 07/12/2021   Procedure: TOTAL HIP ARTHROPLASTY ANTERIOR APPROACH;  Surgeon: Paralee Cancel, MD;  Location: [facility];  Service: Orthopedics;  Laterality: Right;   TOTAL KNEE ARTHROPLASTY     x 2    FAMILY HISTORY: Family History  Problem Relation Age of Onset   Kidney cancer Father    Colon cancer Maternal Grandmother    Lung cancer Maternal Grandfather    Heart attack Maternal Grandfather     SOCIAL HISTORY: Social History   Socioeconomic History   Marital status: Divorced    Spouse name: Not on file   Number of children: Not on file   Years of education: Not on file   Highest education level: Not on file  Occupational History   Not on file  Tobacco Use   Smoking status: Never   Smokeless tobacco: Never  Vaping Use   Vaping Use: Never used  Substance and Sexual Activity   Alcohol use: Yes    Comment: rarely    Drug use: No   Sexual activity: Not on file  Other Topics Concern   Not on file  Social History Narrative   Not on file   Social Determinants of Health   Financial Resource Strain: Not on file  Food Insecurity: Not on file  Transportation Needs: Not on file  Physical Activity: Not on file  Stress: Not on file  Social Connections: Not on file  Intimate Partner Violence: Not on file      PHYSICAL EXAM  Vitals:   11/16/21 1354  BP: 116/64  Pulse: 93  SpO2: 98%  Weight: 243 lb (110.2 kg)  Height: '5\' 5"'$  (1.651 m)   Body mass index is 40.44 kg/m.  Generalized: Well developed, in no acute distress  Chest: Lungs clear to auscultation bilaterally  Neurological examination  Mentation: Alert oriented to time, place, history taking. Follows all commands speech and language fluent Cranial nerve II-XII: Extraocular movements were full, visual field were full on confrontational test Head  turning and shoulder shrug  were normal and symmetric.  Neck circumference 13 inches, Mallampati 2+ Gait and station: Gait is normal.    DIAGNOSTIC DATA (LABS, IMAGING, TESTING) - I reviewed patient records, labs, notes, testing and imaging myself where available.  Lab Results  Component Value Date   WBC 10.7 (H) 07/14/2021   HGB 9.6 (L) 07/14/2021   HCT 29.1 (L) 07/14/2021   MCV 88.7 07/14/2021   PLT 181 07/14/2021  Component Value Date/Time   NA 134 (L) 07/13/2021 1203   K 4.0 07/13/2021 1203   CL 101 07/13/2021 1203   CO2 25 07/13/2021 1203   GLUCOSE 155 (H) 07/13/2021 1203   BUN 18 07/13/2021 1203   CREATININE 0.80 07/13/2021 1203   CALCIUM 9.2 07/13/2021 1203   PROT 7.2 07/01/2021 1237   ALBUMIN 4.1 07/01/2021 1237   AST 22 07/01/2021 1237   ALT 19 07/01/2021 1237   ALKPHOS 106 07/01/2021 1237   BILITOT 0.5 07/01/2021 1237   GFRNONAA >60 07/13/2021 1203      ASSESSMENT AND PLAN 71 y.o. year old female  has a past medical history of Anxiety, Arthritis, Asthma, Candidiasis, Cataracts, bilateral, CPAP (continuous positive airway pressure) dependence, Depression, Distal radius fracture, left, Dyslipidemia, GERD (gastroesophageal reflux disease), Heart murmur, Hematuria, Hypothyroidism, IBS (irritable bowel syndrome), Osteopenia, Pneumonia, Seasonal allergies, Sleep apnea, Stress incontinence, and Toxic shock syndrome ([place]). here with:  OSA on CPAP  - CPAP compliance excellent - Good treatment of AHI  - Encourage patient to use CPAP nightly and > 4 hours each night - F/U in 1 year or sooner if needed    Ward Givens, MSN, NP-C 11/16/2021, 1:50 PM [facility] [address], [place], [place] [address] [phone]

## 2021-11-16 ENCOUNTER — Ambulatory Visit (INDEPENDENT_AMBULATORY_CARE_PROVIDER_SITE_OTHER): Payer: Medicare Other | Admitting: Adult Health

## 2021-11-16 VITALS — BP 116/64 | HR 93 | Ht 65.0 in | Wt 243.0 lb

## 2021-11-16 DIAGNOSIS — Z9989 Dependence on other enabling machines and devices: Secondary | ICD-10-CM

## 2021-11-16 DIAGNOSIS — G4733 Obstructive sleep apnea (adult) (pediatric): Secondary | ICD-10-CM | POA: Diagnosis not present

## 2021-11-16 NOTE — Patient Instructions (Signed)
Continue using CPAP nightly and greater than 4 hours each night Change out supplies regularly If your symptoms worsen or you develop new symptoms please let us know.

## 2021-12-07 DIAGNOSIS — H2513 Age-related nuclear cataract, bilateral: Secondary | ICD-10-CM | POA: Diagnosis not present

## 2021-12-09 ENCOUNTER — Ambulatory Visit
Admission: RE | Admit: 2021-12-09 | Discharge: 2021-12-09 | Disposition: A | Discharge status: Home / Self Care | Payer: Medicare Other | Source: Ambulatory Visit | Attending: Internal Medicine | Admitting: Internal Medicine

## 2021-12-09 DIAGNOSIS — R911 Solitary pulmonary nodule: Secondary | ICD-10-CM | POA: Diagnosis not present

## 2022-01-09 DIAGNOSIS — J302 Other seasonal allergic rhinitis: Secondary | ICD-10-CM | POA: Diagnosis not present

## 2022-01-09 DIAGNOSIS — G4733 Obstructive sleep apnea (adult) (pediatric): Secondary | ICD-10-CM | POA: Diagnosis not present

## 2022-01-09 DIAGNOSIS — J45909 Unspecified asthma, uncomplicated: Secondary | ICD-10-CM | POA: Diagnosis not present

## 2022-01-09 DIAGNOSIS — J209 Acute bronchitis, unspecified: Secondary | ICD-10-CM | POA: Diagnosis not present

## 2022-01-09 DIAGNOSIS — Z1152 Encounter for screening for COVID-19: Secondary | ICD-10-CM | POA: Diagnosis not present

## 2022-01-09 DIAGNOSIS — R0981 Nasal congestion: Secondary | ICD-10-CM | POA: Diagnosis not present

## 2022-01-09 DIAGNOSIS — R5383 Other fatigue: Secondary | ICD-10-CM | POA: Diagnosis not present

## 2022-01-13 DIAGNOSIS — Z1231 Encounter for screening mammogram for malignant neoplasm of breast: Secondary | ICD-10-CM | POA: Diagnosis not present

## 2022-01-13 DIAGNOSIS — Z01419 Encounter for gynecological examination (general) (routine) without abnormal findings: Secondary | ICD-10-CM | POA: Diagnosis not present

## 2022-01-13 DIAGNOSIS — N393 Stress incontinence (female) (male): Secondary | ICD-10-CM | POA: Diagnosis not present

## 2022-01-13 DIAGNOSIS — N951 Menopausal and female climacteric states: Secondary | ICD-10-CM | POA: Diagnosis not present

## 2022-04-20 DIAGNOSIS — Z23 Encounter for immunization: Secondary | ICD-10-CM | POA: Diagnosis not present

## 2022-06-02 DIAGNOSIS — E785 Hyperlipidemia, unspecified: Secondary | ICD-10-CM | POA: Diagnosis not present

## 2022-06-02 DIAGNOSIS — J302 Other seasonal allergic rhinitis: Secondary | ICD-10-CM | POA: Diagnosis not present

## 2022-06-02 DIAGNOSIS — N39498 Other specified urinary incontinence: Secondary | ICD-10-CM | POA: Diagnosis not present

## 2022-06-02 DIAGNOSIS — Z23 Encounter for immunization: Secondary | ICD-10-CM | POA: Diagnosis not present

## 2022-06-02 DIAGNOSIS — B379 Candidiasis, unspecified: Secondary | ICD-10-CM | POA: Diagnosis not present

## 2022-06-02 DIAGNOSIS — R7301 Impaired fasting glucose: Secondary | ICD-10-CM | POA: Diagnosis not present

## 2022-06-02 DIAGNOSIS — F329 Major depressive disorder, single episode, unspecified: Secondary | ICD-10-CM | POA: Diagnosis not present

## 2022-06-02 DIAGNOSIS — E039 Hypothyroidism, unspecified: Secondary | ICD-10-CM | POA: Diagnosis not present

## 2022-06-02 DIAGNOSIS — K589 Irritable bowel syndrome without diarrhea: Secondary | ICD-10-CM | POA: Diagnosis not present

## 2022-06-02 DIAGNOSIS — G4733 Obstructive sleep apnea (adult) (pediatric): Secondary | ICD-10-CM | POA: Diagnosis not present

## 2022-06-02 DIAGNOSIS — J45909 Unspecified asthma, uncomplicated: Secondary | ICD-10-CM | POA: Diagnosis not present

## 2022-10-17 DIAGNOSIS — D1801 Hemangioma of skin and subcutaneous tissue: Secondary | ICD-10-CM | POA: Diagnosis not present

## 2022-10-17 DIAGNOSIS — L812 Freckles: Secondary | ICD-10-CM | POA: Diagnosis not present

## 2022-10-17 DIAGNOSIS — L820 Inflamed seborrheic keratosis: Secondary | ICD-10-CM | POA: Diagnosis not present

## 2022-10-17 DIAGNOSIS — L821 Other seborrheic keratosis: Secondary | ICD-10-CM | POA: Diagnosis not present

## 2022-10-17 DIAGNOSIS — L853 Xerosis cutis: Secondary | ICD-10-CM | POA: Diagnosis not present

## 2022-10-17 DIAGNOSIS — L723 Sebaceous cyst: Secondary | ICD-10-CM | POA: Diagnosis not present

## 2022-11-22 ENCOUNTER — Ambulatory Visit: Payer: Medicare Other | Admitting: Adult Health

## 2022-12-11 DIAGNOSIS — H35373 Puckering of macula, bilateral: Secondary | ICD-10-CM | POA: Diagnosis not present

## 2022-12-19 DIAGNOSIS — Z1212 Encounter for screening for malignant neoplasm of rectum: Secondary | ICD-10-CM | POA: Diagnosis not present

## 2022-12-19 DIAGNOSIS — R7301 Impaired fasting glucose: Secondary | ICD-10-CM | POA: Diagnosis not present

## 2022-12-19 DIAGNOSIS — E039 Hypothyroidism, unspecified: Secondary | ICD-10-CM | POA: Diagnosis not present

## 2022-12-19 DIAGNOSIS — E785 Hyperlipidemia, unspecified: Secondary | ICD-10-CM | POA: Diagnosis not present

## 2022-12-25 DIAGNOSIS — R7301 Impaired fasting glucose: Secondary | ICD-10-CM | POA: Diagnosis not present

## 2022-12-25 DIAGNOSIS — F329 Major depressive disorder, single episode, unspecified: Secondary | ICD-10-CM | POA: Diagnosis not present

## 2022-12-25 DIAGNOSIS — Z0000 Encounter for general adult medical examination without abnormal findings: Secondary | ICD-10-CM | POA: Diagnosis not present

## 2022-12-25 DIAGNOSIS — F900 Attention-deficit hyperactivity disorder, predominantly inattentive type: Secondary | ICD-10-CM | POA: Diagnosis not present

## 2022-12-25 DIAGNOSIS — E039 Hypothyroidism, unspecified: Secondary | ICD-10-CM | POA: Diagnosis not present

## 2022-12-25 DIAGNOSIS — G4733 Obstructive sleep apnea (adult) (pediatric): Secondary | ICD-10-CM | POA: Diagnosis not present

## 2022-12-25 DIAGNOSIS — J45909 Unspecified asthma, uncomplicated: Secondary | ICD-10-CM | POA: Diagnosis not present

## 2022-12-25 DIAGNOSIS — R82998 Other abnormal findings in urine: Secondary | ICD-10-CM | POA: Diagnosis not present

## 2022-12-25 DIAGNOSIS — E785 Hyperlipidemia, unspecified: Secondary | ICD-10-CM | POA: Diagnosis not present

## 2022-12-25 DIAGNOSIS — M8580 Other specified disorders of bone density and structure, unspecified site: Secondary | ICD-10-CM | POA: Diagnosis not present

## 2022-12-25 DIAGNOSIS — Z23 Encounter for immunization: Secondary | ICD-10-CM | POA: Diagnosis not present

## 2022-12-25 DIAGNOSIS — R635 Abnormal weight gain: Secondary | ICD-10-CM | POA: Diagnosis not present

## 2023-01-11 ENCOUNTER — Ambulatory Visit: Payer: Medicare Other | Admitting: Adult Health

## 2023-02-07 IMAGING — CT CT CARDIAC CORONARY ARTERY CALCIUM SCORE
3 series · 14 of 20 positions shown, 16 images · non-contrast
Comparison: 11/01/2004 chest radiograph

CLINICAL DATA: Elevated cholesterol.

EXAM:
CT CARDIAC CORONARY ARTERY CALCIUM SCORE
TECHNIQUE: Non-contrast imaging through the heart was performed using
prospective ECG gating. Image post processing was performed on an
independent workstation, allowing for quantitative analysis of the
heart and coronary arteries. Note that this exam targets the heart
and the chest was not imaged in its entirety.

[Series 2: calcium scoring 2.00 qr36 bestdiast 71% hrt calciu · axial · 0.35mm/px · z∈[+1941,+2017]mm · 4 of 64 slices shown]
[im 13/64  vessel]
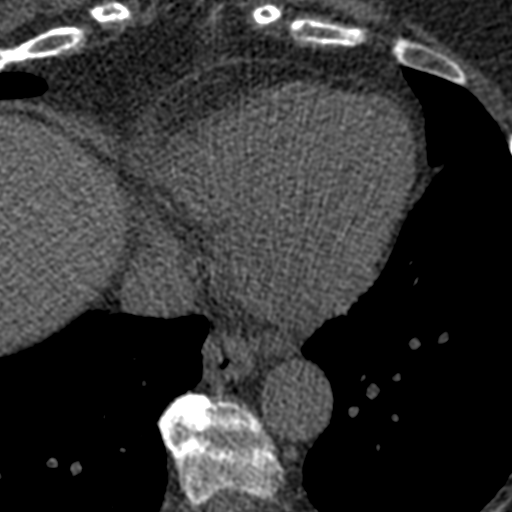
[im 26/64  vessel]
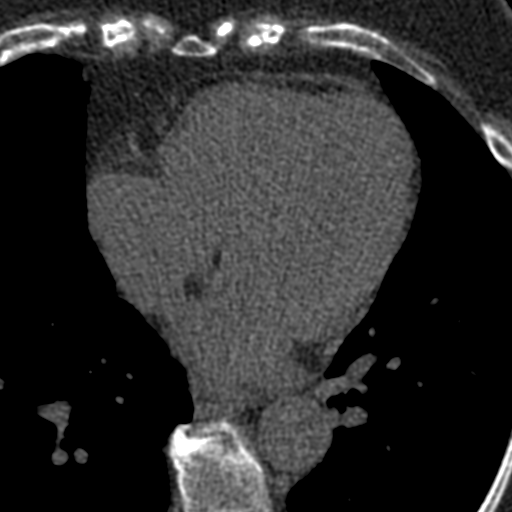
[im 38/64  vessel]
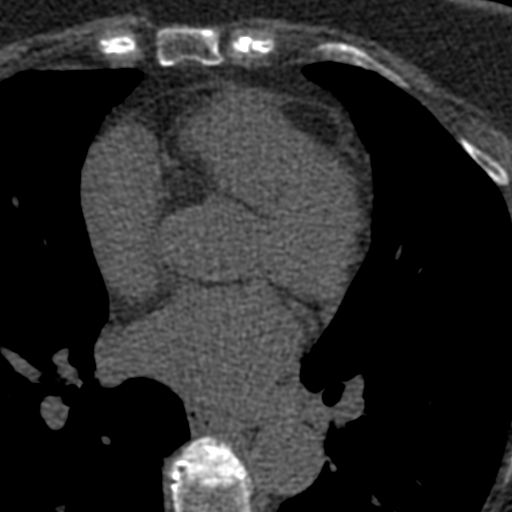
[im 51/64  vessel]
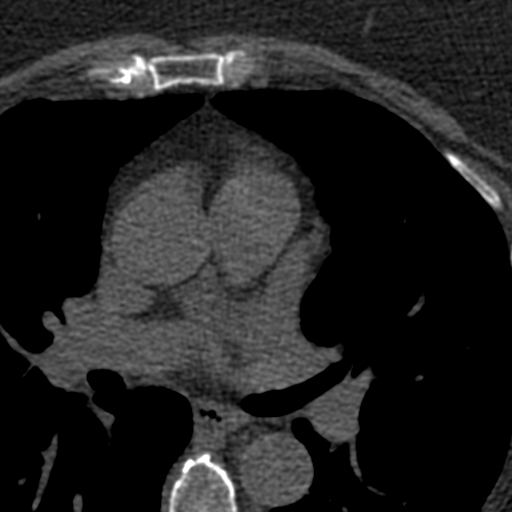

[Series 3: calcium scoring 2.00 br40 bestdiast 71% axial · axial · 0.56mm/px · z∈[+1939,+2021]mm · 5 of 63 slices shown, 7 images]
[im 11/63  vessel]
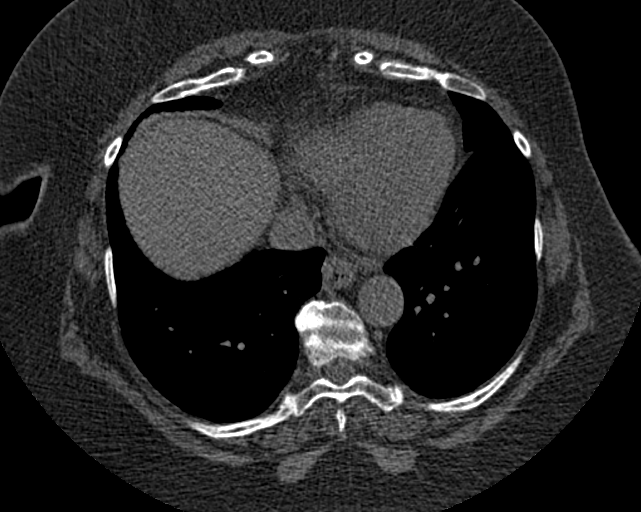
[im 11/63  lung]
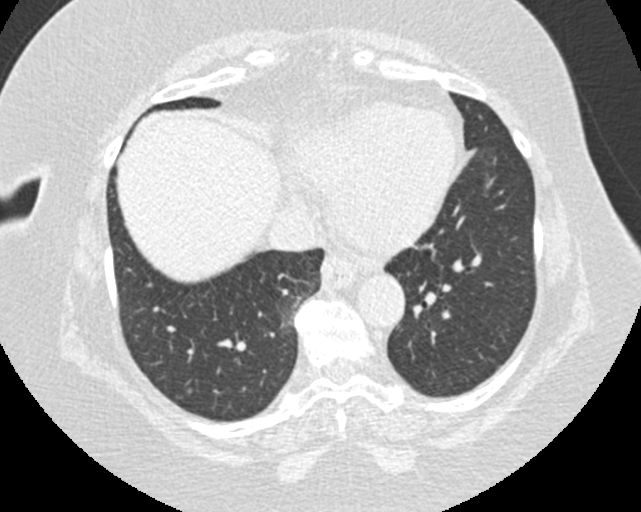
[im 21/63  vessel]
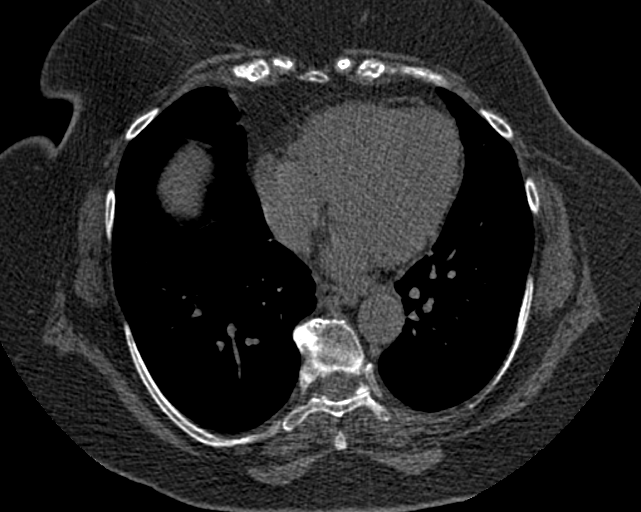
[im 32/63  vessel]
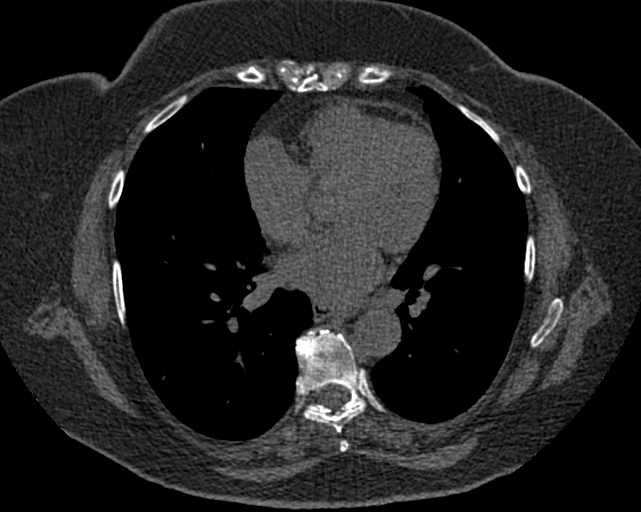
[im 42/63  vessel]
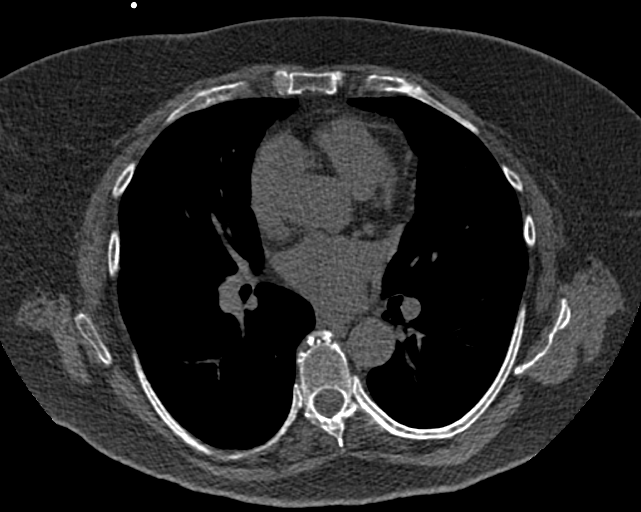
[im 52/63  vessel]
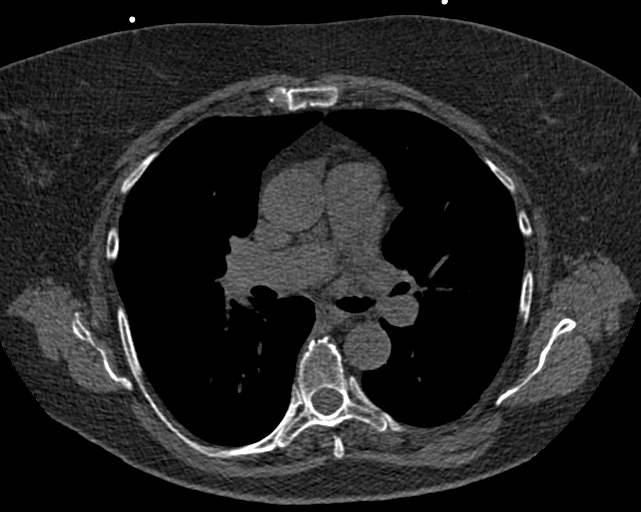
[im 52/63  lung]
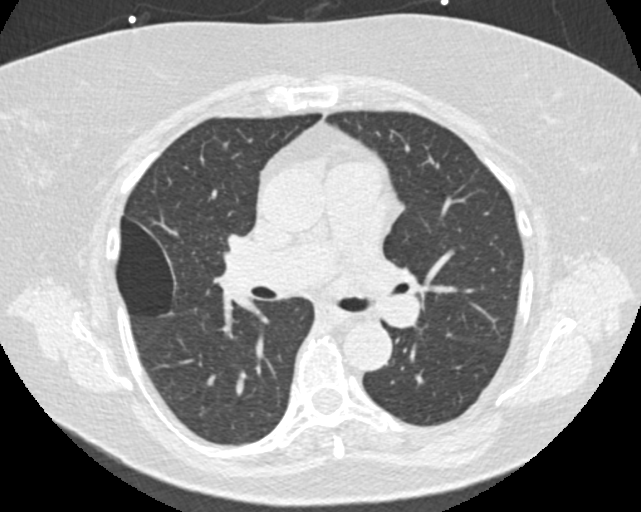

[Series 9: calcium scoring 2.00 br60 bestdiast 71% lungs · axial · 0.56mm/px · z∈[+1939,+2021]mm · 5 of 63 slices shown]
[im 11/63  vessel]
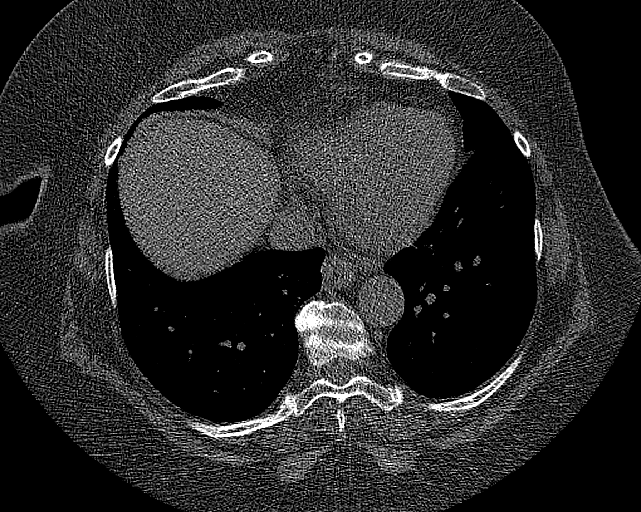
[im 21/63  vessel]
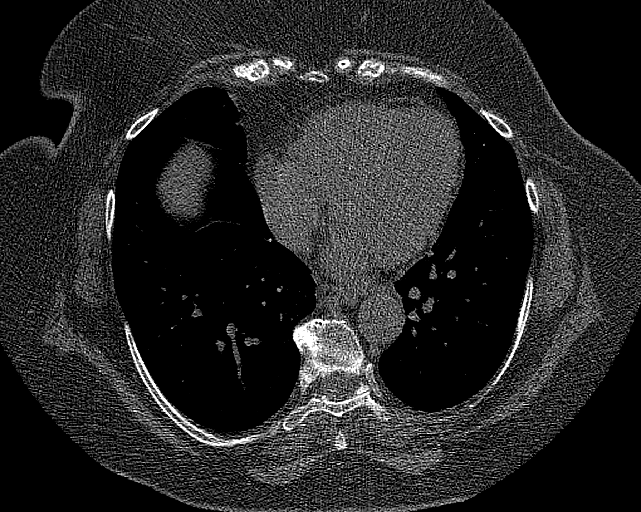
[im 32/63  vessel]
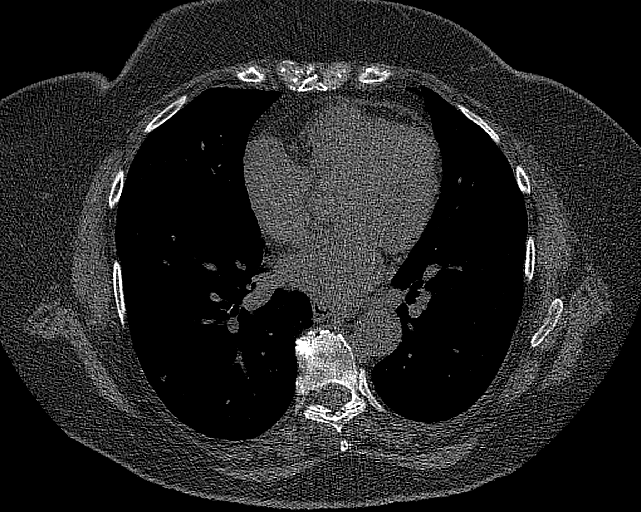
[im 42/63  vessel]
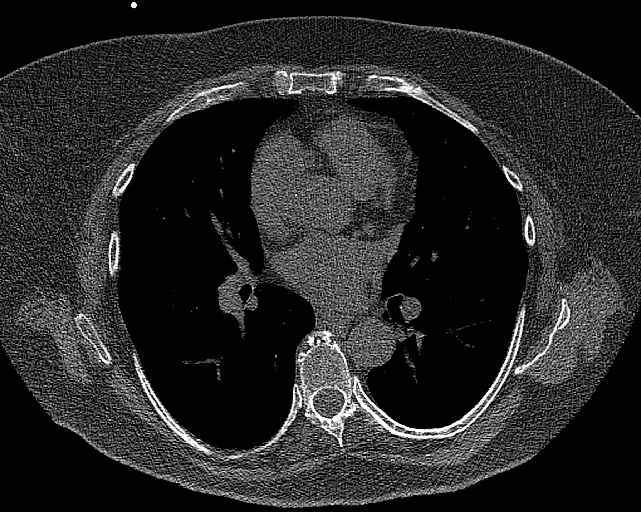
[im 52/63  vessel]
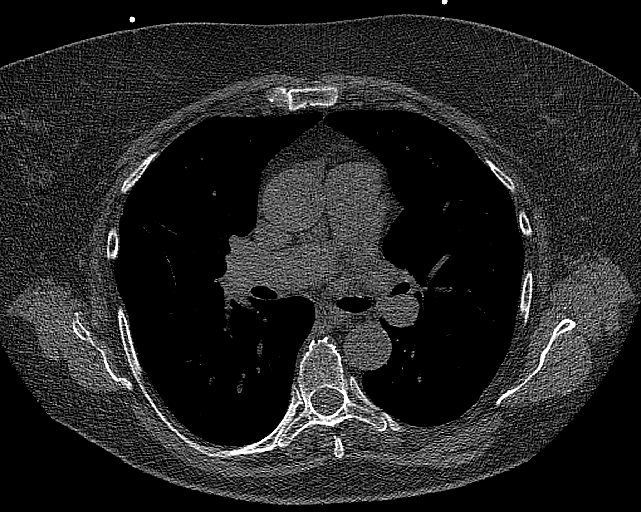

[14 of 20 positions shown; findings below may reference images not displayed]

FINDINGS: Mild limitations secondary to patient body habitus.

CORONARY CALCIUM SCORES:

No convincing evidence of coronary calcification.

AORTA MEASUREMENTS:

Ascending Aorta: 33 mm

Descending Aorta: 25 mm

OTHER FINDINGS:

Cardiovascular: Normal aortic caliber. Normal heart size, without
pericardial effusion.

Mediastinum/Nodes: No imaged thoracic adenopathy. Small hiatal
hernia.

Lungs/Pleura: No pleural fluid. Focal bulla or bleb in the right
upper lobe at 4.5 cm.

Inferior right upper lobe 5 mm nodule on [DATE].

Subpleural right lower lobe 3 mm nodule on 32/9.

Probable subpleural lymph node along the left major fissure at 8 mm
on [DATE].

Upper Abdomen: Normal imaged portions of the liver.

Musculoskeletal: Mild osteopenia with midthoracic spondylosis.
IMPRESSION: 1. Mild limitations secondary to patient body habitus.
2. No convincing evidence of coronary calcification.
3. Bilateral pulmonary nodules of up to 5 mm. No follow-up needed if
patient is low-risk. Non-contrast chest CT can be considered in 12
months if patient is high-risk. This recommendation follows the
consensus statement: Guidelines for Management of Incidental
Pulmonary Nodules Detected on CT Images: From the [HOSPITAL]
4. Small hiatal hernia.

## 2023-02-13 DIAGNOSIS — R911 Solitary pulmonary nodule: Secondary | ICD-10-CM | POA: Diagnosis not present

## 2023-02-13 DIAGNOSIS — B349 Viral infection, unspecified: Secondary | ICD-10-CM | POA: Diagnosis not present

## 2023-02-13 DIAGNOSIS — R051 Acute cough: Secondary | ICD-10-CM | POA: Diagnosis not present

## 2023-02-13 DIAGNOSIS — Z1152 Encounter for screening for COVID-19: Secondary | ICD-10-CM | POA: Diagnosis not present

## 2023-02-13 DIAGNOSIS — L237 Allergic contact dermatitis due to plants, except food: Secondary | ICD-10-CM | POA: Diagnosis not present

## 2023-02-13 DIAGNOSIS — R0602 Shortness of breath: Secondary | ICD-10-CM | POA: Diagnosis not present

## 2023-02-13 DIAGNOSIS — L282 Other prurigo: Secondary | ICD-10-CM | POA: Diagnosis not present

## 2023-02-13 DIAGNOSIS — R0981 Nasal congestion: Secondary | ICD-10-CM | POA: Diagnosis not present

## 2023-02-13 DIAGNOSIS — J45909 Unspecified asthma, uncomplicated: Secondary | ICD-10-CM | POA: Diagnosis not present

## 2023-02-26 DIAGNOSIS — J302 Other seasonal allergic rhinitis: Secondary | ICD-10-CM | POA: Diagnosis not present

## 2023-02-26 DIAGNOSIS — L282 Other prurigo: Secondary | ICD-10-CM | POA: Diagnosis not present

## 2023-02-26 DIAGNOSIS — B349 Viral infection, unspecified: Secondary | ICD-10-CM | POA: Diagnosis not present

## 2023-02-26 DIAGNOSIS — L237 Allergic contact dermatitis due to plants, except food: Secondary | ICD-10-CM | POA: Diagnosis not present

## 2023-02-26 DIAGNOSIS — J45909 Unspecified asthma, uncomplicated: Secondary | ICD-10-CM | POA: Diagnosis not present

## 2023-02-26 DIAGNOSIS — R911 Solitary pulmonary nodule: Secondary | ICD-10-CM | POA: Diagnosis not present

## 2023-02-26 DIAGNOSIS — R051 Acute cough: Secondary | ICD-10-CM | POA: Diagnosis not present

## 2023-03-13 NOTE — Progress Notes (Signed)
PATIENT: Belinda [name] DOB: 1951-02-25  REASON FOR VISIT: follow up HISTORY FROM: patient PRIMARY NEUROLOGIST: Dr. Vickey Huger  Chief Complaint  Patient presents with   Follow-up    Rm 19, cpap follow up. Issues with mask fit.       HISTORY OF PRESENT ILLNESS: Today 03/19/23:  Belinda [name] is a 72 y.o. female with a history of OSA on CPAP. Returns today for follow-up. Reports that CPAP is working well except that the mask is not fitting well. Download is below.        11/16/21: Belinda [name] is a 72 year old female with a history of obstructive sleep apnea on CPAP.  She returns today for follow-up.  She reports that CPAP is working well for her.  She denies any new issues.  She returns today for an evaluation.    REVIEW OF SYSTEMS: Out of a complete 14 system review of symptoms, the patient complains only of the following symptoms, and all other reviewed systems are negative.  FSS 23 ESS 13  ALLERGIES: Allergies  Allergen Reactions   Penicillins Rash    Rash in 80's. Tolerated Cephalosporin 07/12/21      HOME MEDICATIONS: Outpatient Medications Prior to Visit  Medication Sig Dispense Refill   Cholecalciferol (VITAMIN D3) 125 MCG (5000 UT) CAPS Take 5,000 Units by mouth once a week.     clindamycin (CLEOCIN) 150 MG capsule Take 600 mg by mouth once. For dental appt.     fluticasone (FLONASE) 50 MCG/ACT nasal spray Place 1 spray into both nostrils daily.     levothyroxine (SYNTHROID) 200 MCG tablet Take 200 mcg by mouth daily.     montelukast (SINGULAIR) 10 MG tablet Take 10 mg by mouth daily.     sertraline (ZOLOFT) 100 MG tablet Take 100 mg by mouth daily.     VENTOLIN HFA 108 (90 Base) MCG/ACT inhaler Inhale 1-2 puffs into the lungs every 4 (four) hours as needed.   5   Zinc 50 MG TABS Take 50 mg by mouth daily.     ASMANEX 60 METERED DOSES 220 MCG/INH inhaler Inhale 2 puffs into the lungs daily as needed.      Camphor-Menthol-Methyl Sal (SALONPAS EX)  Apply 1 application. topically daily as needed (pain).     cetirizine (ZYRTEC) 10 MG tablet Take 10 mg by mouth daily.     HYDROcodone-acetaminophen (NORCO/VICODIN) 5-325 MG tablet Take 1-2 tablets by mouth every 6 (six) hours as needed for severe pain. 42 tablet 0   Lidocaine-Menthol (ICY HOT MAX LIDOCAINE EX) Apply 1 application. topically daily as needed (pain).     Menthol, Topical Analgesic, (BIOFREEZE EX) Apply 1 application. topically daily as needed (pain).     NYSTATIN powder Apply 1 application. topically 2 (two) times daily as needed (yeast infection).  2   polyethylene glycol (MIRALAX / GLYCOLAX) 17 g packet Take 17 g by mouth daily as needed for mild constipation. 14 each 0   vitamin C (ASCORBIC ACID) 500 MG tablet Take 500 mg by mouth daily.     No facility-administered medications prior to visit.    PAST MEDICAL HISTORY: Past Medical History:  Diagnosis Date   Anxiety    Arthritis    Asthma    Candidiasis    Cataracts, bilateral    CPAP (continuous positive airway pressure) dependence    Depression    Distal radius fracture, left    Dyslipidemia    GERD (gastroesophageal reflux disease)    Heart murmur  born with a functional murmur   Hematuria    Hypothyroidism    IBS (irritable bowel syndrome)    Osteopenia    Pneumonia    Seasonal allergies    Sleep apnea    Stress incontinence    Toxic shock syndrome (HCC)     PAST SURGICAL HISTORY: Past Surgical History:  Procedure Laterality Date   CARPAL TUNNEL RELEASE Left 10/27/2015   Procedure: LEFT CARPAL TUNNEL RELEASE;  Surgeon: Dairl Ponder, MD;  Location: [facility];  Service: Orthopedics;  Laterality: Left;   CATARACT EXTRACTION     CATARACT EXTRACTION  2017   x 2   COLONOSCOPY     JOINT REPLACEMENT Bilateral    OPEN REDUCTION INTERNAL FIXATION (ORIF) DISTAL RADIAL FRACTURE Left 10/27/2015   Procedure: OPEN REDUCTION INTERNAL FIXATION (ORIF) LEFT  DISTAL RADIAL FRACTURE;  Surgeon:  Dairl Ponder, MD;  Location: [facility];  Service: Orthopedics;  Laterality: Left;   TONSILLECTOMY     TOTAL HIP ARTHROPLASTY Right 07/12/2021   Procedure: TOTAL HIP ARTHROPLASTY ANTERIOR APPROACH;  Surgeon: Durene Romans, MD;  Location: [facility];  Service: Orthopedics;  Laterality: Right;   TOTAL KNEE ARTHROPLASTY     x 2    FAMILY HISTORY: Family History  Problem Relation Age of Onset   Kidney cancer Father    Colon cancer Maternal Grandmother    Lung cancer Maternal Grandfather    Heart attack Maternal Grandfather     SOCIAL HISTORY: Social History   Socioeconomic History   Marital status: Divorced    Spouse name: Not on file   Number of children: Not on file   Years of education: Not on file   Highest education level: Not on file  Occupational History   Not on file  Tobacco Use   Smoking status: Never   Smokeless tobacco: Never  Vaping Use   Vaping status: Never Used  Substance and Sexual Activity   Alcohol use: Yes    Comment: rarely    Drug use: No   Sexual activity: Not on file  Other Topics Concern   Not on file  Social History Narrative   Not on file   Social Determinants of Health   Financial Resource Strain: Not on file  Food Insecurity: Not on file  Transportation Needs: Not on file  Physical Activity: Not on file  Stress: Not on file  Social Connections: Not on file  Intimate Partner Violence: Not on file      PHYSICAL EXAM  Vitals:   03/19/23 1110  BP: 122/74  Pulse: 70  SpO2: 99%  Weight: 255 lb 6.4 oz (115.8 kg)  Height: 5\' 6"  (1.676 m)   Body mass index is 41.22 kg/m.  Generalized: Well developed, in no acute distress  Chest: Lungs clear to auscultation bilaterally  Neurological examination  Mentation: Alert oriented to time, place, history taking. Follows all commands speech and language fluent Cranial nerve II-XII: facial symmetry noted    DIAGNOSTIC DATA (LABS, IMAGING, TESTING) - I reviewed patient  records, labs, notes, testing and imaging myself where available.  Lab Results  Component Value Date   WBC 10.7 (H) 07/14/2021   HGB 9.6 (L) 07/14/2021   HCT 29.1 (L) 07/14/2021   MCV 88.7 07/14/2021   PLT 181 07/14/2021      Component Value Date/Time   NA 134 (L) 07/13/2021 1203   K 4.0 07/13/2021 1203   CL 101 07/13/2021 1203   CO2 25 07/13/2021 1203  GLUCOSE 155 (H) 07/13/2021 1203   BUN 18 07/13/2021 1203   CREATININE 0.80 07/13/2021 1203   CALCIUM 9.2 07/13/2021 1203   PROT 7.2 07/01/2021 1237   ALBUMIN 4.1 07/01/2021 1237   AST 22 07/01/2021 1237   ALT 19 07/01/2021 1237   ALKPHOS 106 07/01/2021 1237   BILITOT 0.5 07/01/2021 1237   GFRNONAA >60 07/13/2021 1203      ASSESSMENT AND PLAN 72 y.o. year old female  has a past medical history of Anxiety, Arthritis, Asthma, Candidiasis, Cataracts, bilateral, CPAP (continuous positive airway pressure) dependence, Depression, Distal radius fracture, left, Dyslipidemia, GERD (gastroesophageal reflux disease), Heart murmur, Hematuria, Hypothyroidism, IBS (irritable bowel syndrome), Osteopenia, Pneumonia, Seasonal allergies, Sleep apnea, Stress incontinence, and Toxic shock syndrome (HCC). here with:  OSA on CPAP  - CPAP compliance excellent - Good treatment of AHI  - Encourage patient to use CPAP nightly and > 4 hours each night - order sent for mask refitting - F/U in 1 year or sooner if needed    Butch Penny, MSN, NP-C 03/19/2023, 11:30 AM [facility] [address], [address] [place], [place] [address] [phone]

## 2023-03-19 ENCOUNTER — Ambulatory Visit (INDEPENDENT_AMBULATORY_CARE_PROVIDER_SITE_OTHER): Payer: Medicare Other | Admitting: Adult Health

## 2023-03-19 ENCOUNTER — Encounter: Payer: Self-pay | Admitting: Adult Health

## 2023-03-19 VITALS — BP 122/74 | HR 70 | Ht 66.0 in | Wt 255.4 lb

## 2023-03-19 DIAGNOSIS — G4733 Obstructive sleep apnea (adult) (pediatric): Secondary | ICD-10-CM

## 2023-03-28 ENCOUNTER — Telehealth: Payer: Self-pay | Admitting: Anesthesiology

## 2023-03-28 NOTE — Telephone Encounter (Signed)
Order for mask refitting sent to [facility]. Order received.

## 2023-07-16 DIAGNOSIS — G4733 Obstructive sleep apnea (adult) (pediatric): Secondary | ICD-10-CM | POA: Diagnosis not present

## 2023-07-16 DIAGNOSIS — J302 Other seasonal allergic rhinitis: Secondary | ICD-10-CM | POA: Diagnosis not present

## 2023-07-16 DIAGNOSIS — R7301 Impaired fasting glucose: Secondary | ICD-10-CM | POA: Diagnosis not present

## 2023-07-16 DIAGNOSIS — J45909 Unspecified asthma, uncomplicated: Secondary | ICD-10-CM | POA: Diagnosis not present

## 2023-07-16 DIAGNOSIS — L237 Allergic contact dermatitis due to plants, except food: Secondary | ICD-10-CM | POA: Diagnosis not present

## 2023-07-16 DIAGNOSIS — F900 Attention-deficit hyperactivity disorder, predominantly inattentive type: Secondary | ICD-10-CM | POA: Diagnosis not present

## 2023-07-16 DIAGNOSIS — E039 Hypothyroidism, unspecified: Secondary | ICD-10-CM | POA: Diagnosis not present

## 2023-07-16 DIAGNOSIS — M1990 Unspecified osteoarthritis, unspecified site: Secondary | ICD-10-CM | POA: Diagnosis not present

## 2023-07-16 DIAGNOSIS — E785 Hyperlipidemia, unspecified: Secondary | ICD-10-CM | POA: Diagnosis not present

## 2023-07-16 DIAGNOSIS — R051 Acute cough: Secondary | ICD-10-CM | POA: Diagnosis not present

## 2023-07-16 DIAGNOSIS — M8580 Other specified disorders of bone density and structure, unspecified site: Secondary | ICD-10-CM | POA: Diagnosis not present

## 2024-01-01 DIAGNOSIS — H35373 Puckering of macula, bilateral: Secondary | ICD-10-CM | POA: Diagnosis not present

## 2024-02-28 DIAGNOSIS — B372 Candidiasis of skin and nail: Secondary | ICD-10-CM | POA: Diagnosis not present

## 2024-03-03 DIAGNOSIS — Z1212 Encounter for screening for malignant neoplasm of rectum: Secondary | ICD-10-CM | POA: Diagnosis not present

## 2024-03-03 DIAGNOSIS — E785 Hyperlipidemia, unspecified: Secondary | ICD-10-CM | POA: Diagnosis not present

## 2024-03-05 ENCOUNTER — Ambulatory Visit: Admitting: Podiatry

## 2024-03-05 ENCOUNTER — Encounter: Payer: Self-pay | Admitting: Podiatry

## 2024-03-05 VITALS — Ht 66.0 in | Wt 255.4 lb

## 2024-03-05 DIAGNOSIS — M79675 Pain in left toe(s): Secondary | ICD-10-CM

## 2024-03-05 DIAGNOSIS — B351 Tinea unguium: Secondary | ICD-10-CM

## 2024-03-05 DIAGNOSIS — M79674 Pain in right toe(s): Secondary | ICD-10-CM | POA: Diagnosis not present

## 2024-03-05 NOTE — Progress Notes (Signed)
   Chief Complaint  Patient presents with   Callouses    Pt is here due to callous on the bottom and side of both feet, she states they are causing some pain, also has trouble clipping toenails due to the thickness of them.    SUBJECTIVE Patient presents to office today complaining of elongated, thickened nails that cause pain while ambulating in shoes.  Patient is unable to trim their own nails. Patient is here for further evaluation and treatment.  Past Medical History:  Diagnosis Date   Anxiety    Arthritis    Asthma    Candidiasis    Cataracts, bilateral    CPAP (continuous positive airway pressure) dependence    Depression    Distal radius fracture, left    Dyslipidemia    GERD (gastroesophageal reflux disease)    Heart murmur    born with a functional murmur   Hematuria    Hypothyroidism    IBS (irritable bowel syndrome)    Osteopenia    Pneumonia    Seasonal allergies    Sleep apnea    Stress incontinence    Toxic shock syndrome (HCC)     Allergies  Allergen Reactions   Penicillins Rash    Rash in 80's. Tolerated Cephalosporin 07/12/21       OBJECTIVE General Patient is awake, alert, and oriented x 3 and in no acute distress. Derm Skin is dry and supple bilateral. Negative open lesions or macerations. Remaining integument unremarkable. Nails are tender, long, thickened and dystrophic with subungual debris, consistent with onychomycosis, 1-5 bilateral. No signs of infection noted. Vasc  DP and PT pedal pulses palpable bilaterally. Temperature gradient within normal limits.  Neuro Epicritic and protective threshold sensation grossly intact bilaterally.  Musculoskeletal Exam No symptomatic pedal deformities noted bilateral. Muscular strength within normal limits.  ASSESSMENT 1.  Pain due to onychomycosis of toenails both  PLAN OF CARE 1. Patient evaluated today.  2. Instructed to maintain good pedal hygiene and foot care.  3. Mechanical debridement of  nails 1-5 bilaterally performed using a nail nipper. Filed with dremel without incident.  4. Return to clinic in 3 mos.    Thresa EMERSON Sar, DPM [facility]  Dr. Thresa EMERSON Sar, DPM    [address] [place], [place] [address]                Office [phone]  Fax [phone]

## 2024-03-10 DIAGNOSIS — R82998 Other abnormal findings in urine: Secondary | ICD-10-CM | POA: Diagnosis not present

## 2024-03-17 DIAGNOSIS — G5603 Carpal tunnel syndrome, bilateral upper limbs: Secondary | ICD-10-CM | POA: Diagnosis not present

## 2024-03-20 ENCOUNTER — Ambulatory Visit: Payer: Medicare Other | Admitting: Adult Health

## 2024-03-20 ENCOUNTER — Encounter: Payer: Self-pay | Admitting: Adult Health

## 2024-03-20 VITALS — BP 123/75 | HR 71 | Ht 66.0 in | Wt 260.6 lb

## 2024-03-20 DIAGNOSIS — G4733 Obstructive sleep apnea (adult) (pediatric): Secondary | ICD-10-CM | POA: Diagnosis not present

## 2024-03-20 NOTE — Patient Instructions (Signed)
 Continue using CPAP nightly and greater than 4 hours each night If your symptoms worsen or you develop new symptoms please let us  know.

## 2024-03-20 NOTE — Progress Notes (Signed)
 PATIENT: Belinda [name] DOB: 1950-11-11  REASON FOR VISIT: follow up HISTORY FROM: patient PRIMARY NEUROLOGIST: Dr. Chalice  Chief Complaint  Patient presents with   RM 4     Patient is here for CPAP follow-up no issues with mask or machine  ESS - 9     HISTORY OF PRESENT ILLNESS: Today 03/20/24:  Belinda [name] is a 73 y.o. female with a history of obstructive sleep apnea on CPAP. Returns today for follow-up.  She reports that CPAP is working well.  She finally has a mask that she likes which is the nasal pillows.  Her download is below.     03/19/23: Belinda [name] is a 73 y.o. female with a history of OSA on CPAP. Returns today for follow-up. Reports that CPAP is working well except that the mask is not fitting well. Download is below.        11/16/21: Belinda [name] is a 73 year old female with a history of obstructive sleep apnea on CPAP.  She returns today for follow-up.  She reports that CPAP is working well for her.  She denies any new issues.  She returns today for an evaluation.    REVIEW OF SYSTEMS: Out of a complete 14 system review of symptoms, the patient complains only of the following symptoms, and all other reviewed systems are negative.   ESS 9  ALLERGIES: Allergies  Allergen Reactions   Penicillins Rash    Rash in 80's. Tolerated Cephalosporin 07/12/21      HOME MEDICATIONS: Outpatient Medications Prior to Visit  Medication Sig Dispense Refill   atorvastatin (LIPITOR) 10 MG tablet Take 10 mg by mouth daily.     Cholecalciferol (VITAMIN D3) 125 MCG (5000 UT) CAPS Take 5,000 Units by mouth once a week.     clindamycin (CLEOCIN) 150 MG capsule Take 600 mg by mouth once. For dental appt.     fluticasone  (FLONASE ) 50 MCG/ACT nasal spray Place 1 spray into both nostrils daily.     levothyroxine  (SYNTHROID ) 200 MCG tablet Take 200 mcg by mouth daily.     montelukast  (SINGULAIR ) 10 MG tablet Take 10 mg by mouth daily.     sertraline  (ZOLOFT )  100 MG tablet Take 100 mg by mouth daily.     Zinc 50 MG TABS Take 50 mg by mouth daily. (Patient not taking: Reported on 03/20/2024)     No facility-administered medications prior to visit.    PAST MEDICAL HISTORY: Past Medical History:  Diagnosis Date   Anxiety    Arthritis    Asthma    Candidiasis    Cataracts, bilateral    CPAP (continuous positive airway pressure) dependence    Depression    Distal radius fracture, left    Dyslipidemia    GERD (gastroesophageal reflux disease)    Heart murmur    born with a functional murmur   Hematuria    Hypothyroidism    IBS (irritable bowel syndrome)    Osteopenia    Pneumonia    Seasonal allergies    Sleep apnea    Stress incontinence    Toxic shock syndrome (HCC)     PAST SURGICAL HISTORY: Past Surgical History:  Procedure Laterality Date   CARPAL TUNNEL RELEASE Left 10/27/2015   Procedure: LEFT CARPAL TUNNEL RELEASE;  Surgeon: Donnice Robinsons, MD;  Location: [facility];  Service: Orthopedics;  Laterality: Left;   CATARACT EXTRACTION     CATARACT EXTRACTION  2017   x 2  COLONOSCOPY     JOINT REPLACEMENT Bilateral    OPEN REDUCTION INTERNAL FIXATION (ORIF) DISTAL RADIAL FRACTURE Left 10/27/2015   Procedure: OPEN REDUCTION INTERNAL FIXATION (ORIF) LEFT  DISTAL RADIAL FRACTURE;  Surgeon: Donnice Robinsons, MD;  Location: [facility];  Service: Orthopedics;  Laterality: Left;   TONSILLECTOMY     TOTAL HIP ARTHROPLASTY Right 07/12/2021   Procedure: TOTAL HIP ARTHROPLASTY ANTERIOR APPROACH;  Surgeon: Ernie Donnice, MD;  Location: [facility];  Service: Orthopedics;  Laterality: Right;   TOTAL KNEE ARTHROPLASTY     x 2    FAMILY HISTORY: Family History  Problem Relation Age of Onset   Kidney cancer Father    Colon cancer Maternal Grandmother    Lung cancer Maternal Grandfather    Heart attack Maternal Grandfather     SOCIAL HISTORY: Social History   Socioeconomic History   Marital status:  Divorced    Spouse name: Not on file   Number of children: Not on file   Years of education: Not on file   Highest education level: Not on file  Occupational History   Not on file  Tobacco Use   Smoking status: Never   Smokeless tobacco: Never  Vaping Use   Vaping status: Never Used  Substance and Sexual Activity   Alcohol  use: Yes    Comment: rarely    Drug use: No   Sexual activity: Not on file  Other Topics Concern   Not on file  Social History Narrative   Not on file   Social Drivers of Health   Financial Resource Strain: Not on file  Food Insecurity: Not on file  Transportation Needs: Not on file  Physical Activity: Not on file  Stress: Not on file  Social Connections: Not on file  Intimate Partner Violence: Not on file      PHYSICAL EXAM  Vitals:   03/20/24 1315  BP: 123/75  Pulse: 71  SpO2: 98%  Weight: 260 lb 9.6 oz (118.2 kg)  Height: 5' 6 (1.676 m)   Body mass index is 42.06 kg/m.  Generalized: Well developed, in no acute distress  Chest: Lungs clear to auscultation bilaterally  Neurological examination  Mentation: Alert oriented to time, place, history taking. Follows all commands speech and language fluent Cranial nerve II-XII: facial symmetry noted    DIAGNOSTIC DATA (LABS, IMAGING, TESTING) - I reviewed patient records, labs, notes, testing and imaging myself where available.  Lab Results  Component Value Date   WBC 10.7 (H) 07/14/2021   HGB 9.6 (L) 07/14/2021   HCT 29.1 (L) 07/14/2021   MCV 88.7 07/14/2021   PLT 181 07/14/2021      Component Value Date/Time   NA 134 (L) 07/13/2021 1203   K 4.0 07/13/2021 1203   CL 101 07/13/2021 1203   CO2 25 07/13/2021 1203   GLUCOSE 155 (H) 07/13/2021 1203   BUN 18 07/13/2021 1203   CREATININE 0.80 07/13/2021 1203   CALCIUM 9.2 07/13/2021 1203   PROT 7.2 07/01/2021 1237   ALBUMIN  4.1 07/01/2021 1237   AST 22 07/01/2021 1237   ALT 19 07/01/2021 1237   ALKPHOS 106 07/01/2021 1237    BILITOT 0.5 07/01/2021 1237   GFRNONAA >60 07/13/2021 1203      ASSESSMENT AND PLAN 73 y.o. year old female  has a past medical history of Anxiety, Arthritis, Asthma, Candidiasis, Cataracts, bilateral, CPAP (continuous positive airway pressure) dependence, Depression, Distal radius fracture, left, Dyslipidemia, GERD (gastroesophageal reflux disease), Heart murmur, Hematuria, Hypothyroidism, IBS (  irritable bowel syndrome), Osteopenia, Pneumonia, Seasonal allergies, Sleep apnea, Stress incontinence, and Toxic shock syndrome (HCC). here with:  OSA on CPAP  - CPAP compliance excellent - Good treatment of AHI  - Encourage patient to use CPAP nightly and > 4 hours each night - F/U in 1 year or sooner if needed    Duwaine Russell, MSN, NP-C 03/20/2024, 1:33 PM [facility] [address] [place], [place] [address] [phone]  The patient's condition requires frequent monitoring and adjustments in the treatment plan, reflecting the ongoing complexity of care.  This provider is the continuing focal point for all needed services for this condition.

## 2024-03-27 DIAGNOSIS — Z1272 Encounter for screening for malignant neoplasm of vagina: Secondary | ICD-10-CM | POA: Diagnosis not present

## 2024-03-27 DIAGNOSIS — Z1151 Encounter for screening for human papillomavirus (HPV): Secondary | ICD-10-CM | POA: Diagnosis not present

## 2024-03-27 DIAGNOSIS — Z0142 Encounter for cervical smear to confirm findings of recent normal smear following initial abnormal smear: Secondary | ICD-10-CM | POA: Diagnosis not present

## 2024-03-27 DIAGNOSIS — Z780 Asymptomatic menopausal state: Secondary | ICD-10-CM | POA: Diagnosis not present

## 2024-03-27 DIAGNOSIS — Z1231 Encounter for screening mammogram for malignant neoplasm of breast: Secondary | ICD-10-CM | POA: Diagnosis not present

## 2024-03-27 DIAGNOSIS — N952 Postmenopausal atrophic vaginitis: Secondary | ICD-10-CM | POA: Diagnosis not present

## 2024-03-27 DIAGNOSIS — Z01411 Encounter for gynecological examination (general) (routine) with abnormal findings: Secondary | ICD-10-CM | POA: Diagnosis not present

## 2024-03-27 DIAGNOSIS — B372 Candidiasis of skin and nail: Secondary | ICD-10-CM | POA: Diagnosis not present

## 2024-03-27 DIAGNOSIS — Z124 Encounter for screening for malignant neoplasm of cervix: Secondary | ICD-10-CM | POA: Diagnosis not present

## 2025-03-23 ENCOUNTER — Ambulatory Visit: Admitting: Adult Health
